# Patient Record
Sex: Female | Born: 1945
Health system: Southern US, Community
[De-identification: ages and names within clinical notes are randomized; demographics above are authoritative.]

## PROBLEM LIST (undated history)

## (undated) DIAGNOSIS — I10 Essential (primary) hypertension: Secondary | ICD-10-CM

## (undated) DIAGNOSIS — G589 Mononeuropathy, unspecified: Secondary | ICD-10-CM

## (undated) DIAGNOSIS — S149XXA Injury of unspecified nerves of neck, initial encounter: Secondary | ICD-10-CM

## (undated) DIAGNOSIS — E7800 Pure hypercholesterolemia, unspecified: Secondary | ICD-10-CM

## (undated) DIAGNOSIS — C4492 Squamous cell carcinoma of skin, unspecified: Secondary | ICD-10-CM

## (undated) HISTORY — PX: OTHER SURGICAL HISTORY: SHX169

## (undated) HISTORY — PX: NECK SURGERY: SHX720

---
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
## 1997-08-17 ENCOUNTER — Other Ambulatory Visit: Admission: RE | Admit: 1997-08-17 | Discharge: 1997-08-17 | Payer: Self-pay | Admitting: Gynecology

## 1997-08-25 ENCOUNTER — Other Ambulatory Visit: Admission: RE | Admit: 1997-08-25 | Discharge: 1997-08-25 | Payer: Self-pay | Admitting: Gynecology

## 1998-08-22 ENCOUNTER — Other Ambulatory Visit: Admission: RE | Admit: 1998-08-22 | Discharge: 1998-08-22 | Payer: Self-pay | Admitting: Gynecology

## 1999-08-20 ENCOUNTER — Encounter: Admission: RE | Admit: 1999-08-20 | Discharge: 1999-08-20 | Payer: Self-pay | Admitting: Gynecology

## 1999-08-20 ENCOUNTER — Encounter: Payer: Self-pay | Admitting: Gynecology

## 1999-08-28 ENCOUNTER — Other Ambulatory Visit: Admission: RE | Admit: 1999-08-28 | Discharge: 1999-08-28 | Payer: Self-pay | Admitting: Gynecology

## 2000-08-20 ENCOUNTER — Encounter: Payer: Self-pay | Admitting: Gynecology

## 2000-08-20 ENCOUNTER — Encounter: Admission: RE | Admit: 2000-08-20 | Discharge: 2000-08-20 | Payer: Self-pay | Admitting: Gynecology

## 2000-08-28 ENCOUNTER — Other Ambulatory Visit: Admission: RE | Admit: 2000-08-28 | Discharge: 2000-08-28 | Payer: Self-pay | Admitting: Gynecology

## 2001-08-21 ENCOUNTER — Encounter: Admission: RE | Admit: 2001-08-21 | Discharge: 2001-08-21 | Payer: Self-pay | Admitting: Gynecology

## 2001-08-21 ENCOUNTER — Encounter: Payer: Self-pay | Admitting: Gynecology

## 2001-09-01 ENCOUNTER — Other Ambulatory Visit: Admission: RE | Admit: 2001-09-01 | Discharge: 2001-09-01 | Payer: Self-pay | Admitting: Gynecology

## 2002-09-03 ENCOUNTER — Other Ambulatory Visit: Admission: RE | Admit: 2002-09-03 | Discharge: 2002-09-03 | Payer: Self-pay | Admitting: Obstetrics and Gynecology

## 2003-09-09 ENCOUNTER — Other Ambulatory Visit: Admission: RE | Admit: 2003-09-09 | Discharge: 2003-09-09 | Payer: Self-pay | Admitting: Obstetrics and Gynecology

## 2003-09-28 ENCOUNTER — Encounter: Admission: RE | Admit: 2003-09-28 | Discharge: 2003-09-28 | Payer: Self-pay | Admitting: *Deleted

## 2003-10-26 ENCOUNTER — Ambulatory Visit (HOSPITAL_COMMUNITY): Admission: RE | Admit: 2003-10-26 | Discharge: 2003-10-27 | Payer: Self-pay | Admitting: Orthopaedic Surgery

## 2004-09-11 ENCOUNTER — Other Ambulatory Visit: Admission: RE | Admit: 2004-09-11 | Discharge: 2004-09-11 | Payer: Self-pay | Admitting: Obstetrics and Gynecology

## 2005-09-24 ENCOUNTER — Ambulatory Visit (HOSPITAL_COMMUNITY): Admission: RE | Admit: 2005-09-24 | Discharge: 2005-09-24 | Payer: Self-pay | Admitting: Obstetrics and Gynecology

## 2007-11-03 ENCOUNTER — Ambulatory Visit (HOSPITAL_COMMUNITY): Admission: RE | Admit: 2007-11-03 | Discharge: 2007-11-03 | Payer: Self-pay | Admitting: Obstetrics and Gynecology

## 2010-08-24 NOTE — Op Note (Signed)
NAME:  Kayla Garner, Kayla Garner                          ACCOUNT NO.:  [id number]   MEDICAL RECORD NO.:  [id number]                   PATIENT TYPE:  OIB   LOCATION:  NA                                   FACILITY:  [facility]   PHYSICIAN:  Sharolyn Douglas, M.D.                     DATE OF BIRTH:  Jan 20, 1946   DATE OF PROCEDURE:  10/26/2003  DATE OF DISCHARGE:                                 OPERATIVE REPORT   PREOPERATIVE DIAGNOSIS:  Cervical spondylotic radiculopathy, C5-6, C6-7.   POSTOPERATIVE DIAGNOSIS:  Cervical spondylotic radiculopathy, C5-6, C6-7.   OPERATION PERFORMED:  1. Anterior cervical diskectomy, C5-6 and C6-7 with decompression of the     spinal cord and nerve roots bilaterally.  2. Anterior cervical arthrodesis, C5-6 and C6-7, placement of two allograft     prosthesis spacers packed with local autogenous bone graft.  3. Anterior cervical plating C5-6 and C6-7 using the spinal concepts system.   SURGEON:  Sharolyn Douglas, M.D.   ASSISTANT:  Verlin Fester, P.A.   ANESTHESIA:  General endotracheal.   COMPLICATIONS:  None.   INDICATIONS FOR PROCEDURE:  The patient is a very pleasant 65 year old  female with neck and right upper extremity pain secondary to cervical  spondylotic radiculopathy, C5-6 and C6-7.  She has failed conservative  treatment options.  She has elected to undergo ACDF at C5-6 and C6-7 in  hopes of improving the symptoms.  Risks and benefits were discussed.   DESCRIPTION OF PROCEDURE:  The patient was properly identified in the  holding area and taken to the operating room.  She underwent general  endotracheal anesthesia without difficulty.  She was given prophylactic  intravenous antibiotics.  Carefully positioned on the operating table with  the neck in slight extension using the Mayfield head rest.  Five pounds of  halter traction was applied.  The neck was prepped and draped in the usual  sterile fashion.  A 4 cm incision was made at the level of the thyroid  cartilage along the left side of the neck in transverse fashion.  Dissection  was carried sharply through the platysma.  The interval between  sternocleidomastoid and strap muscles medially was developed down to the  prevertebral space.  The esophagus trachea and carotid sheath were  identified and protected at all times.  The C5-6 level was easily  identifiable by the anterior osteophyte.  A spinal needle was placed and x-  ray taken to confirm this location.  The longus colli muscle was elevated  out over the C5-6 and C6-7 disk spaces bilaterally.  Deep Shadowline  retractor placed.  Anterior osteophyte removed with Leksell rongeur.  Caspar  distraction pins were placed in C5, C6 and C7 vertebral bodies.  Gentle  distraction was applied.  Diskectomy carried back to the posterior  longitudinal ligament.  The disk material was degenerated.  There were large  uncovertebral spurs.  The microscope was draped and brought into the field.  High speed bur used to remove the cartilaginous end plates, to take down the  uncovertebral spurs and posterior vertebral margins.  A 2 mm Kerrison  rongeur used to complete  wide foraminotomies bilaterally.  At C5-6, the  posterior longitudinal ligament was taken down.  The foramina were  decompressed.  At C6-7 again the posterior longitudinal ligament was taken  down and the foramina were decompressed.  There was a chronic appearing disk  osteophyte complex on the right side which was impressing upon the thecal  sac.  This was completely decompressed.  Bleeding was controlled with  bipolar electrocautery and Gelfoam.  7 mm allograft prosthesis spacers were  packed with local autogenous bone graft collected from the bur shavings.  The prosthesis spacers were then packed into the interspace at C5-6 and C6-7  and carefully countersunk 1 mm.  We had a good interference bed.  The weight  was removed from the head.  Caspar distraction was removed.  We then placed   a 40 mm Spinal Concepts anterior cervical plate with six 12 mm locking  screws.  We had good screw purchase.  We ensured the locking mechanism  engaged.  The wound was irrigated.  Esophagus, trachea, carotid sheath  examined.  There were no apparent injuries.  Deep Penrose drain was placed.  Platysma was closed with interrupted 2-0 Vicryl sutures.  Subcutaneous layer  closed with interrupted 3-0 Vicryl followed by a running 4-0 subcuticular  Vicryl suture on the skin.  Benzoin and Steri-Strips were placed.  Sterile  dressing applied.  Soft collar placed.  The patient was extubated without  difficulty and transferred to the recovery room in stable condition able to  move her upper and lower extremities.                                               Sharolyn Douglas, M.D.    MC/MEDQ  D:  10/26/2003  T:  10/27/2003  Job:  [id number]

## 2011-04-23 DIAGNOSIS — C4492 Squamous cell carcinoma of skin, unspecified: Secondary | ICD-10-CM

## 2011-04-23 HISTORY — DX: Squamous cell carcinoma of skin, unspecified: C44.92

## 2011-11-26 ENCOUNTER — Other Ambulatory Visit: Payer: Self-pay | Admitting: Obstetrics and Gynecology

## 2011-11-26 DIAGNOSIS — Z1231 Encounter for screening mammogram for malignant neoplasm of breast: Secondary | ICD-10-CM

## 2011-11-28 ENCOUNTER — Ambulatory Visit
Admission: RE | Admit: 2011-11-28 | Discharge: 2011-11-28 | Disposition: A | Discharge status: Home / Self Care | Payer: Medicare PPO | Source: Ambulatory Visit | Attending: Obstetrics and Gynecology | Admitting: Obstetrics and Gynecology

## 2011-11-28 DIAGNOSIS — Z1231 Encounter for screening mammogram for malignant neoplasm of breast: Secondary | ICD-10-CM

## 2012-05-25 ENCOUNTER — Other Ambulatory Visit (HOSPITAL_COMMUNITY)
Admission: RE | Admit: 2012-05-25 | Discharge: 2012-05-25 | Disposition: A | Discharge status: Home / Self Care | Payer: Medicare PPO | Source: Ambulatory Visit | Attending: Obstetrics and Gynecology | Admitting: Obstetrics and Gynecology

## 2012-05-25 ENCOUNTER — Other Ambulatory Visit: Payer: Self-pay | Admitting: Obstetrics and Gynecology

## 2012-05-25 DIAGNOSIS — Z124 Encounter for screening for malignant neoplasm of cervix: Secondary | ICD-10-CM | POA: Insufficient documentation

## 2012-05-25 DIAGNOSIS — Z1151 Encounter for screening for human papillomavirus (HPV): Secondary | ICD-10-CM | POA: Insufficient documentation

## 2013-03-14 ENCOUNTER — Emergency Department (HOSPITAL_BASED_OUTPATIENT_CLINIC_OR_DEPARTMENT_OTHER): Payer: Medicare PPO

## 2013-03-14 ENCOUNTER — Encounter (HOSPITAL_BASED_OUTPATIENT_CLINIC_OR_DEPARTMENT_OTHER): Payer: Self-pay | Admitting: Emergency Medicine

## 2013-03-14 ENCOUNTER — Emergency Department (HOSPITAL_BASED_OUTPATIENT_CLINIC_OR_DEPARTMENT_OTHER)
Admission: EM | Admit: 2013-03-14 | Discharge: 2013-03-14 | Disposition: A | Discharge status: Home / Self Care | Payer: Medicare PPO | Attending: Emergency Medicine | Admitting: Emergency Medicine

## 2013-03-14 DIAGNOSIS — E7800 Pure hypercholesterolemia, unspecified: Secondary | ICD-10-CM | POA: Insufficient documentation

## 2013-03-14 DIAGNOSIS — Z9889 Other specified postprocedural states: Secondary | ICD-10-CM | POA: Insufficient documentation

## 2013-03-14 DIAGNOSIS — M542 Cervicalgia: Secondary | ICD-10-CM

## 2013-03-14 DIAGNOSIS — Z8669 Personal history of other diseases of the nervous system and sense organs: Secondary | ICD-10-CM | POA: Insufficient documentation

## 2013-03-14 DIAGNOSIS — I10 Essential (primary) hypertension: Secondary | ICD-10-CM | POA: Insufficient documentation

## 2013-03-14 DIAGNOSIS — Z7982 Long term (current) use of aspirin: Secondary | ICD-10-CM | POA: Insufficient documentation

## 2013-03-14 DIAGNOSIS — Z79899 Other long term (current) drug therapy: Secondary | ICD-10-CM | POA: Insufficient documentation

## 2013-03-14 HISTORY — DX: Mononeuropathy, unspecified: G58.9

## 2013-03-14 HISTORY — DX: Pure hypercholesterolemia, unspecified: E78.00

## 2013-03-14 HISTORY — DX: Injury of unspecified nerves of neck, initial encounter: S14.9XXA

## 2013-03-14 HISTORY — DX: Essential (primary) hypertension: I10

## 2013-03-14 MED ORDER — CYCLOBENZAPRINE HCL 10 MG PO TABS: 10.0000 mg | ORAL_TABLET | Freq: Three times a day (TID) | ORAL | Status: AC | PRN

## 2013-03-14 MED ORDER — KETOROLAC TROMETHAMINE 60 MG/2ML IM SOLN
60.0000 mg | Freq: Once | INTRAMUSCULAR | Status: AC
  Administered 2013-03-14: 60 mg via INTRAMUSCULAR
  Filled 2013-03-14: qty 2

## 2013-03-14 MED ORDER — CYCLOBENZAPRINE HCL 10 MG PO TABS
10.0000 mg | ORAL_TABLET | Freq: Once | ORAL | Status: AC
  Administered 2013-03-14: 10 mg via ORAL
  Filled 2013-03-14: qty 1

## 2013-03-14 NOTE — ED Notes (Signed)
Left sided neck pain x 2 weeks.  Sneezed and made it worse.

## 2013-03-14 NOTE — ED Provider Notes (Signed)
CSN: [id number]     Arrival date & time 03/14/13  0831 History   First MD Initiated Contact with Patient 03/14/13 [phone]     Chief Complaint  Patient presents with  . Neck Pain   (Consider location/radiation/quality/duration/timing/severity/associated sxs/prior Treatment) HPI Comments: Patient is a 67 year old female with history of cervical fusion performed 15 years ago. She presents today with a two-week history of pain in the left side of her neck. She denies any injury or trauma. She denies any radiation down her arm and denies any weakness. She has no bowel or bladder complaints. She states that she sneezed yesterday and this seemed to make her discomforts substantially worse.  Patient is a 67 y.o. female presenting with neck pain. The history is provided by the patient.  Neck Pain Pain location:  L side Quality:  Cramping Pain radiates to:  Does not radiate Pain severity:  Moderate Pain is:  Same all the time Onset quality:  Gradual Duration:  2 weeks Timing:  Constant Progression:  Worsening Chronicity:  New Context: not fall   Relieved by:  Nothing Worsened by:  Sneezing Ineffective treatments:  Heat   Past Medical History  Diagnosis Date  . Pinched nerve in neck   . Hypertension   . Hypercholesteremia    Past Surgical History  Procedure Laterality Date  . Neck surgery    . Titanium plate and screws in neck     No family history on file. History  Substance Use Topics  . Smoking status: Never Smoker   . Smokeless tobacco: Not on file  . Alcohol Use: Not on file   OB History   Grav Para Term Preterm Abortions TAB SAB Ect Mult Living                 Review of Systems  Musculoskeletal: Positive for neck pain.  All other systems reviewed and are negative.    Allergies  Review of patient's allergies indicates no known allergies.  Home Medications   Current Outpatient Rx  Name  Route  Sig  Dispense  Refill  . amLODipine (NORVASC) 5 MG tablet   Oral  Take 5 mg by mouth daily.         . Ascorbic Acid (VITAMIN C) 1000 MG tablet   Oral   Take 1,000 mg by mouth daily.         Marland Kitchen aspirin 81 MG tablet   Oral   Take 81 mg by mouth daily.         . cetirizine (ZYRTEC) 10 MG tablet   Oral   Take 10 mg by mouth daily.         . Cholecalciferol (VITAMIN D3) 2000 UNITS TABS   Oral   Take 1 tablet by mouth daily.         . Coenzyme Q10 (CO Q-10) 200 MG CAPS   Oral   Take 1 capsule by mouth daily.         Marland Kitchen DHEA 25 MG CAPS   Oral   Take 1 capsule by mouth.         . diphenhydrAMINE (BENADRYL) 25 mg capsule   Oral   Take 25 mg by mouth every 6 (six) hours as needed.         Boris Lown Oil (MAXIMUM RED KRILL PO)   Oral   Take 500 mg by mouth daily.         Marland Kitchen loratadine (CLARITIN) 10 MG tablet   Oral  Take 10 mg by mouth daily.         Marland Kitchen lovastatin (MEVACOR) 20 MG tablet   Oral   Take 20 mg by mouth at bedtime.         . metoprolol tartrate (LOPRESSOR) 25 MG tablet   Oral   Take 25 mg by mouth 2 (two) times daily.         . Multiple Vitamins-Minerals (CENTRUM SILVER ULTRA WOMENS PO)   Oral   Take 1 tablet by mouth daily.         . simethicone (MYLICON) 125 MG chewable tablet   Oral   Chew 125 mg by mouth every 6 (six) hours as needed for flatulence.          BP 135/93  Pulse 94  Temp(Src) 98.1 F (36.7 C) (Oral)  Resp 18  Ht 5\' 3"  (1.6 m)  Wt 117 lb (53.071 kg)  BMI 20.73 kg/m2  SpO2 100% Physical Exam  Nursing note reviewed. Constitutional: She is oriented to person, place, and time. She appears well-developed and well-nourished. No distress.  HENT:  Head: Normocephalic and atraumatic.  Neck: Normal range of motion. Neck supple.  There is tenderness to palpation on the lateral aspect of the left neck. There is no palpable abnormality. There is no nuchal rigidity. She does have increasing pain with turning her head to the left.  Musculoskeletal: Normal range of motion. She exhibits no  edema.  Lymphadenopathy:    She has no cervical adenopathy.  Neurological: She is alert and oriented to person, place, and time.  Skin: Skin is warm and dry. She is not diaphoretic.    ED Course  Procedures (including critical care time) Labs Review Labs Reviewed - No data to display Imaging Review No results found.    MDM  No diagnosis found. Patient with history of cervical fusion presents with neck pain. Her neurologic exam is unremarkable and x-rays show the hardware to be in appropriate positioning. She will be given Toradol and Flexeril and discharged with NSAIDs and Flexeril. She is to return if her symptoms worsen and if she does not improve she is to follow up with her spine    Geoffery Lyons, MD 03/14/13 [phone]

## 2013-05-31 ENCOUNTER — Other Ambulatory Visit: Payer: Self-pay

## 2013-05-31 DIAGNOSIS — Z1231 Encounter for screening mammogram for malignant neoplasm of breast: Secondary | ICD-10-CM

## 2013-06-18 ENCOUNTER — Ambulatory Visit
Admission: RE | Admit: 2013-06-18 | Discharge: 2013-06-18 | Disposition: A | Discharge status: Home / Self Care | Payer: Medicare PPO | Source: Ambulatory Visit

## 2013-06-18 DIAGNOSIS — Z1231 Encounter for screening mammogram for malignant neoplasm of breast: Secondary | ICD-10-CM

## 2014-04-15 DIAGNOSIS — N12 Tubulo-interstitial nephritis, not specified as acute or chronic: Secondary | ICD-10-CM | POA: Diagnosis not present

## 2014-04-15 DIAGNOSIS — R6883 Chills (without fever): Secondary | ICD-10-CM | POA: Diagnosis not present

## 2014-04-15 DIAGNOSIS — R350 Frequency of micturition: Secondary | ICD-10-CM | POA: Diagnosis not present

## 2014-05-06 DIAGNOSIS — R319 Hematuria, unspecified: Secondary | ICD-10-CM | POA: Diagnosis not present

## 2014-05-31 DIAGNOSIS — M859 Disorder of bone density and structure, unspecified: Secondary | ICD-10-CM | POA: Diagnosis not present

## 2014-05-31 DIAGNOSIS — M8580 Other specified disorders of bone density and structure, unspecified site: Secondary | ICD-10-CM | POA: Diagnosis not present

## 2014-07-25 ENCOUNTER — Other Ambulatory Visit (HOSPITAL_COMMUNITY): Payer: Self-pay | Admitting: Obstetrics and Gynecology

## 2014-07-25 DIAGNOSIS — J301 Allergic rhinitis due to pollen: Secondary | ICD-10-CM | POA: Diagnosis not present

## 2014-07-25 DIAGNOSIS — I10 Essential (primary) hypertension: Secondary | ICD-10-CM | POA: Diagnosis not present

## 2014-07-25 DIAGNOSIS — E780 Pure hypercholesterolemia: Secondary | ICD-10-CM | POA: Diagnosis not present

## 2014-07-25 DIAGNOSIS — Z0000 Encounter for general adult medical examination without abnormal findings: Secondary | ICD-10-CM | POA: Diagnosis not present

## 2014-07-25 DIAGNOSIS — Z1231 Encounter for screening mammogram for malignant neoplasm of breast: Secondary | ICD-10-CM

## 2014-07-25 DIAGNOSIS — G3184 Mild cognitive impairment, so stated: Secondary | ICD-10-CM | POA: Diagnosis not present

## 2014-07-25 DIAGNOSIS — E559 Vitamin D deficiency, unspecified: Secondary | ICD-10-CM | POA: Diagnosis not present

## 2014-08-02 ENCOUNTER — Ambulatory Visit (HOSPITAL_COMMUNITY)
Admission: RE | Admit: 2014-08-02 | Discharge: 2014-08-02 | Disposition: A | Discharge status: Home / Self Care | Payer: Commercial Managed Care - HMO | Source: Ambulatory Visit | Attending: Obstetrics and Gynecology | Admitting: Obstetrics and Gynecology

## 2014-08-02 DIAGNOSIS — Z1231 Encounter for screening mammogram for malignant neoplasm of breast: Secondary | ICD-10-CM | POA: Diagnosis not present

## 2015-02-14 DIAGNOSIS — G3184 Mild cognitive impairment, so stated: Secondary | ICD-10-CM | POA: Diagnosis not present

## 2015-06-08 ENCOUNTER — Other Ambulatory Visit: Payer: Self-pay | Admitting: Gastroenterology

## 2015-06-08 DIAGNOSIS — D120 Benign neoplasm of cecum: Secondary | ICD-10-CM | POA: Diagnosis not present

## 2015-06-08 DIAGNOSIS — D126 Benign neoplasm of colon, unspecified: Secondary | ICD-10-CM | POA: Diagnosis not present

## 2015-06-08 DIAGNOSIS — Z86010 Personal history of colonic polyps: Secondary | ICD-10-CM | POA: Diagnosis not present

## 2015-06-08 DIAGNOSIS — K5730 Diverticulosis of large intestine without perforation or abscess without bleeding: Secondary | ICD-10-CM | POA: Diagnosis not present

## 2015-07-04 ENCOUNTER — Other Ambulatory Visit: Payer: Self-pay

## 2015-07-04 DIAGNOSIS — Z1231 Encounter for screening mammogram for malignant neoplasm of breast: Secondary | ICD-10-CM

## 2015-07-27 DIAGNOSIS — J301 Allergic rhinitis due to pollen: Secondary | ICD-10-CM | POA: Diagnosis not present

## 2015-07-27 DIAGNOSIS — G3184 Mild cognitive impairment, so stated: Secondary | ICD-10-CM | POA: Diagnosis not present

## 2015-07-27 DIAGNOSIS — E559 Vitamin D deficiency, unspecified: Secondary | ICD-10-CM | POA: Diagnosis not present

## 2015-07-27 DIAGNOSIS — Z0000 Encounter for general adult medical examination without abnormal findings: Secondary | ICD-10-CM | POA: Diagnosis not present

## 2015-07-27 DIAGNOSIS — I10 Essential (primary) hypertension: Secondary | ICD-10-CM | POA: Diagnosis not present

## 2015-07-27 DIAGNOSIS — E7800 Pure hypercholesterolemia, unspecified: Secondary | ICD-10-CM | POA: Diagnosis not present

## 2015-08-04 ENCOUNTER — Ambulatory Visit
Admission: RE | Admit: 2015-08-04 | Discharge: 2015-08-04 | Disposition: A | Discharge status: Home / Self Care | Payer: Commercial Managed Care - HMO | Source: Ambulatory Visit

## 2015-08-04 DIAGNOSIS — Z1231 Encounter for screening mammogram for malignant neoplasm of breast: Secondary | ICD-10-CM

## 2015-08-08 ENCOUNTER — Other Ambulatory Visit: Payer: Self-pay | Admitting: Obstetrics and Gynecology

## 2015-08-08 DIAGNOSIS — R928 Other abnormal and inconclusive findings on diagnostic imaging of breast: Secondary | ICD-10-CM

## 2015-08-29 ENCOUNTER — Ambulatory Visit
Admission: RE | Admit: 2015-08-29 | Discharge: 2015-08-29 | Disposition: A | Discharge status: Home / Self Care | Payer: Commercial Managed Care - HMO | Source: Ambulatory Visit | Attending: Obstetrics and Gynecology | Admitting: Obstetrics and Gynecology

## 2015-08-29 DIAGNOSIS — R928 Other abnormal and inconclusive findings on diagnostic imaging of breast: Secondary | ICD-10-CM

## 2015-08-29 DIAGNOSIS — N63 Unspecified lump in breast: Secondary | ICD-10-CM | POA: Diagnosis not present

## 2016-01-09 DIAGNOSIS — G3184 Mild cognitive impairment, so stated: Secondary | ICD-10-CM | POA: Diagnosis not present

## 2016-03-19 ENCOUNTER — Other Ambulatory Visit: Payer: Self-pay | Admitting: Obstetrics and Gynecology

## 2016-03-19 DIAGNOSIS — N6310 Unspecified lump in the right breast, unspecified quadrant: Secondary | ICD-10-CM

## 2016-04-09 ENCOUNTER — Ambulatory Visit
Admission: RE | Admit: 2016-04-09 | Discharge: 2016-04-09 | Disposition: A | Discharge status: Home / Self Care | Payer: Commercial Managed Care - HMO | Source: Ambulatory Visit | Attending: Obstetrics and Gynecology | Admitting: Obstetrics and Gynecology

## 2016-04-09 ENCOUNTER — Other Ambulatory Visit: Payer: Self-pay | Admitting: Obstetrics and Gynecology

## 2016-04-09 DIAGNOSIS — N6001 Solitary cyst of right breast: Secondary | ICD-10-CM | POA: Diagnosis not present

## 2016-04-09 DIAGNOSIS — R922 Inconclusive mammogram: Secondary | ICD-10-CM | POA: Diagnosis not present

## 2016-04-09 DIAGNOSIS — N6310 Unspecified lump in the right breast, unspecified quadrant: Secondary | ICD-10-CM

## 2016-04-30 DIAGNOSIS — F0391 Unspecified dementia with behavioral disturbance: Secondary | ICD-10-CM | POA: Diagnosis not present

## 2016-06-27 DIAGNOSIS — G301 Alzheimer's disease with late onset: Secondary | ICD-10-CM | POA: Diagnosis not present

## 2016-06-27 DIAGNOSIS — R634 Abnormal weight loss: Secondary | ICD-10-CM | POA: Diagnosis not present

## 2016-06-27 DIAGNOSIS — F0281 Dementia in other diseases classified elsewhere with behavioral disturbance: Secondary | ICD-10-CM | POA: Diagnosis not present

## 2016-07-31 DIAGNOSIS — E559 Vitamin D deficiency, unspecified: Secondary | ICD-10-CM | POA: Diagnosis not present

## 2016-07-31 DIAGNOSIS — Z0000 Encounter for general adult medical examination without abnormal findings: Secondary | ICD-10-CM | POA: Diagnosis not present

## 2016-07-31 DIAGNOSIS — R350 Frequency of micturition: Secondary | ICD-10-CM | POA: Diagnosis not present

## 2016-07-31 DIAGNOSIS — E7800 Pure hypercholesterolemia, unspecified: Secondary | ICD-10-CM | POA: Diagnosis not present

## 2016-07-31 DIAGNOSIS — I10 Essential (primary) hypertension: Secondary | ICD-10-CM | POA: Diagnosis not present

## 2016-07-31 DIAGNOSIS — R634 Abnormal weight loss: Secondary | ICD-10-CM | POA: Diagnosis not present

## 2016-07-31 DIAGNOSIS — F0390 Unspecified dementia without behavioral disturbance: Secondary | ICD-10-CM | POA: Diagnosis not present

## 2016-08-12 DIAGNOSIS — J209 Acute bronchitis, unspecified: Secondary | ICD-10-CM | POA: Diagnosis not present

## 2016-08-26 DIAGNOSIS — Z87440 Personal history of urinary (tract) infections: Secondary | ICD-10-CM | POA: Diagnosis not present

## 2016-08-26 DIAGNOSIS — R350 Frequency of micturition: Secondary | ICD-10-CM | POA: Diagnosis not present

## 2016-10-02 DIAGNOSIS — R404 Transient alteration of awareness: Secondary | ICD-10-CM | POA: Diagnosis not present

## 2016-10-02 DIAGNOSIS — R55 Syncope and collapse: Secondary | ICD-10-CM | POA: Diagnosis not present

## 2016-10-03 DIAGNOSIS — R55 Syncope and collapse: Secondary | ICD-10-CM | POA: Diagnosis not present

## 2016-10-07 ENCOUNTER — Telehealth: Payer: Self-pay

## 2016-10-07 NOTE — Telephone Encounter (Signed)
SENT NOTE SCHEDULING

## 2016-10-10 ENCOUNTER — Other Ambulatory Visit: Payer: Self-pay | Admitting: Obstetrics and Gynecology

## 2016-10-10 DIAGNOSIS — N6001 Solitary cyst of right breast: Secondary | ICD-10-CM

## 2016-10-11 DIAGNOSIS — G309 Alzheimer's disease, unspecified: Secondary | ICD-10-CM | POA: Diagnosis not present

## 2016-10-11 DIAGNOSIS — F0281 Dementia in other diseases classified elsewhere with behavioral disturbance: Secondary | ICD-10-CM | POA: Diagnosis not present

## 2016-10-22 ENCOUNTER — Ambulatory Visit
Admission: RE | Admit: 2016-10-22 | Discharge: 2016-10-22 | Disposition: A | Discharge status: Home / Self Care | Payer: Commercial Managed Care - HMO | Source: Ambulatory Visit | Attending: Obstetrics and Gynecology | Admitting: Obstetrics and Gynecology

## 2016-10-22 DIAGNOSIS — N6001 Solitary cyst of right breast: Secondary | ICD-10-CM

## 2016-10-22 DIAGNOSIS — R922 Inconclusive mammogram: Secondary | ICD-10-CM | POA: Diagnosis not present

## 2016-10-22 DIAGNOSIS — N6311 Unspecified lump in the right breast, upper outer quadrant: Secondary | ICD-10-CM | POA: Diagnosis not present

## 2016-11-04 DIAGNOSIS — R7301 Impaired fasting glucose: Secondary | ICD-10-CM | POA: Diagnosis not present

## 2016-11-24 DIAGNOSIS — R55 Syncope and collapse: Secondary | ICD-10-CM | POA: Insufficient documentation

## 2016-11-24 NOTE — Progress Notes (Deleted)
Cardiology Office Note    Date:  11/24/2016   ID:  DERISHA Garner, DOB 1946-01-26, MRN [id number]  PCP:  Darcus Austin, MD  Cardiologist: Sinclair Grooms, MD   No chief complaint on file.   History of Present Illness:  Kayla Garner is a 71 y.o. female referred for evaluation of syncope.    Past Medical History:  Diagnosis Date  . Hypercholesteremia   . Hypertension   . Pinched nerve in neck     Past Surgical History:  Procedure Laterality Date  . NECK SURGERY    . titanium plate and screws in neck      Current Medications: Outpatient Medications Prior to Visit  Medication Sig Dispense Refill  . amLODipine (NORVASC) 5 MG tablet Take 5 mg by mouth daily.    . Ascorbic Acid (VITAMIN C) 1000 MG tablet Take 1,000 mg by mouth daily.    Marland Kitchen aspirin 81 MG tablet Take 81 mg by mouth daily.    . cetirizine (ZYRTEC) 10 MG tablet Take 10 mg by mouth daily.    . Cholecalciferol (VITAMIN D3) 2000 UNITS TABS Take 1 tablet by mouth daily.    . Coenzyme Q10 (CO Q-10) 200 MG CAPS Take 1 capsule by mouth daily.    . cyclobenzaprine (FLEXERIL) 10 MG tablet Take 1 tablet (10 mg total) by mouth 3 (three) times daily as needed for muscle spasms. 20 tablet 0  . DHEA 25 MG CAPS Take 1 capsule by mouth.    . diphenhydrAMINE (BENADRYL) 25 mg capsule Take 25 mg by mouth every 6 (six) hours as needed.    Javier Docker Oil (MAXIMUM RED KRILL PO) Take 500 mg by mouth daily.    Marland Kitchen loratadine (CLARITIN) 10 MG tablet Take 10 mg by mouth daily.    Marland Kitchen lovastatin (MEVACOR) 20 MG tablet Take 20 mg by mouth at bedtime.    . metoprolol tartrate (LOPRESSOR) 25 MG tablet Take 25 mg by mouth 2 (two) times daily.    . Multiple Vitamins-Minerals (CENTRUM SILVER ULTRA WOMENS PO) Take 1 tablet by mouth daily.    . simethicone (MYLICON) 270 MG chewable tablet Chew 125 mg by mouth every 6 (six) hours as needed for flatulence.     No facility-administered medications prior to visit.      Allergies:   Patient has no  known allergies.   Social History   Social History  . Marital status: Married    Spouse name: N/A  . Number of children: N/A  . Years of education: N/A   Social History Main Topics  . Smoking status: Never Smoker  . Smokeless tobacco: Not on file  . Alcohol use Not on file  . Drug use: Unknown  . Sexual activity: Not on file   Other Topics Concern  . Not on file   Social History Narrative  . No narrative on file     Family History:  The patient's ***family history is not on file.   ROS:   Please see the history of present illness.    ***  All other systems reviewed and are negative.   PHYSICAL EXAM:   VS:  There were no vitals taken for this visit.   GEN: Well nourished, well developed, in no acute distress  HEENT: normal  Neck: no JVD, carotid bruits, or masses Cardiac: ***RRR; no murmurs, rubs, or gallops,no edema  Respiratory:  clear to auscultation bilaterally, normal work of breathing GI: soft, nontender, nondistended, + BS  MS: no deformity or atrophy  Skin: warm and dry, no rash Neuro:  Alert and Oriented x 3, Strength and sensation are intact Psych: euthymic mood, full affect  Wt Readings from Last 3 Encounters:  03/14/13 117 lb (53.1 kg)      Studies/Labs Reviewed:   EKG:  EKG  ***  Recent Labs: No results found for requested labs within last 8760 hours.   Lipid Panel No results found for: CHOL, TRIG, HDL, CHOLHDL, VLDL, LDLCALC, LDLDIRECT  Additional studies/ records that were reviewed today include:  ***    ASSESSMENT:    1. Syncope, unspecified syncope type      PLAN:  In order of problems listed above:  1. ***    Medication Adjustments/Labs and Tests Ordered: Current medicines are reviewed at length with the patient today.  Concerns regarding medicines are outlined above.  Medication changes, Labs and Tests ordered today are listed in the Patient Instructions below. There are no Patient Instructions on file for this visit.    Signed, Sinclair Grooms, MD  11/24/2016 2:57 PM    [facility] [address], [place], [place]  [address] Phone: [phone]; Fax: [phone]

## 2016-11-25 ENCOUNTER — Ambulatory Visit: Payer: Commercial Managed Care - HMO | Admitting: Interventional Cardiology

## 2016-12-26 DIAGNOSIS — N390 Urinary tract infection, site not specified: Secondary | ICD-10-CM | POA: Diagnosis not present

## 2017-02-17 DIAGNOSIS — D485 Neoplasm of uncertain behavior of skin: Secondary | ICD-10-CM | POA: Diagnosis not present

## 2017-08-13 DIAGNOSIS — I10 Essential (primary) hypertension: Secondary | ICD-10-CM | POA: Diagnosis not present

## 2017-08-13 DIAGNOSIS — Z1159 Encounter for screening for other viral diseases: Secondary | ICD-10-CM | POA: Diagnosis not present

## 2017-08-13 DIAGNOSIS — Z23 Encounter for immunization: Secondary | ICD-10-CM | POA: Diagnosis not present

## 2017-08-13 DIAGNOSIS — E559 Vitamin D deficiency, unspecified: Secondary | ICD-10-CM | POA: Diagnosis not present

## 2017-08-13 DIAGNOSIS — J301 Allergic rhinitis due to pollen: Secondary | ICD-10-CM | POA: Diagnosis not present

## 2017-08-13 DIAGNOSIS — E7800 Pure hypercholesterolemia, unspecified: Secondary | ICD-10-CM | POA: Diagnosis not present

## 2017-08-13 DIAGNOSIS — Z0000 Encounter for general adult medical examination without abnormal findings: Secondary | ICD-10-CM | POA: Diagnosis not present

## 2017-08-13 DIAGNOSIS — F0390 Unspecified dementia without behavioral disturbance: Secondary | ICD-10-CM | POA: Diagnosis not present

## 2017-09-17 ENCOUNTER — Other Ambulatory Visit: Payer: Self-pay | Admitting: Obstetrics and Gynecology

## 2017-09-17 DIAGNOSIS — N6009 Solitary cyst of unspecified breast: Secondary | ICD-10-CM

## 2017-10-23 ENCOUNTER — Ambulatory Visit
Admission: RE | Admit: 2017-10-23 | Discharge: 2017-10-23 | Disposition: A | Discharge status: Home / Self Care | Payer: Commercial Managed Care - HMO | Source: Ambulatory Visit | Attending: Obstetrics and Gynecology | Admitting: Obstetrics and Gynecology

## 2017-10-23 ENCOUNTER — Ambulatory Visit
Admission: RE | Admit: 2017-10-23 | Discharge: 2017-10-23 | Disposition: A | Discharge status: Home / Self Care | Payer: Medicare HMO | Source: Ambulatory Visit | Attending: Obstetrics and Gynecology | Admitting: Obstetrics and Gynecology

## 2017-10-23 DIAGNOSIS — N6311 Unspecified lump in the right breast, upper outer quadrant: Secondary | ICD-10-CM | POA: Diagnosis not present

## 2017-10-23 DIAGNOSIS — R922 Inconclusive mammogram: Secondary | ICD-10-CM | POA: Diagnosis not present

## 2017-10-23 DIAGNOSIS — N6009 Solitary cyst of unspecified breast: Secondary | ICD-10-CM

## 2017-10-31 DIAGNOSIS — G301 Alzheimer's disease with late onset: Secondary | ICD-10-CM | POA: Diagnosis not present

## 2017-10-31 DIAGNOSIS — F0281 Dementia in other diseases classified elsewhere with behavioral disturbance: Secondary | ICD-10-CM | POA: Diagnosis not present

## 2018-01-19 DIAGNOSIS — Z23 Encounter for immunization: Secondary | ICD-10-CM | POA: Diagnosis not present

## 2018-05-08 DIAGNOSIS — F0281 Dementia in other diseases classified elsewhere with behavioral disturbance: Secondary | ICD-10-CM | POA: Diagnosis not present

## 2018-05-08 DIAGNOSIS — G301 Alzheimer's disease with late onset: Secondary | ICD-10-CM | POA: Diagnosis not present

## 2018-08-21 DIAGNOSIS — E559 Vitamin D deficiency, unspecified: Secondary | ICD-10-CM | POA: Diagnosis not present

## 2018-08-21 DIAGNOSIS — E7800 Pure hypercholesterolemia, unspecified: Secondary | ICD-10-CM | POA: Diagnosis not present

## 2018-08-21 DIAGNOSIS — Z79899 Other long term (current) drug therapy: Secondary | ICD-10-CM | POA: Diagnosis not present

## 2018-08-25 DIAGNOSIS — Z0000 Encounter for general adult medical examination without abnormal findings: Secondary | ICD-10-CM | POA: Diagnosis not present

## 2018-08-25 DIAGNOSIS — E7800 Pure hypercholesterolemia, unspecified: Secondary | ICD-10-CM | POA: Diagnosis not present

## 2018-08-25 DIAGNOSIS — I10 Essential (primary) hypertension: Secondary | ICD-10-CM | POA: Diagnosis not present

## 2018-08-25 DIAGNOSIS — F322 Major depressive disorder, single episode, severe without psychotic features: Secondary | ICD-10-CM | POA: Diagnosis not present

## 2018-08-25 DIAGNOSIS — F0390 Unspecified dementia without behavioral disturbance: Secondary | ICD-10-CM | POA: Diagnosis not present

## 2018-08-25 DIAGNOSIS — E559 Vitamin D deficiency, unspecified: Secondary | ICD-10-CM | POA: Diagnosis not present

## 2018-09-15 ENCOUNTER — Other Ambulatory Visit: Payer: Self-pay | Admitting: Obstetrics and Gynecology

## 2018-09-15 DIAGNOSIS — Z1231 Encounter for screening mammogram for malignant neoplasm of breast: Secondary | ICD-10-CM

## 2018-09-15 DIAGNOSIS — Z20828 Contact with and (suspected) exposure to other viral communicable diseases: Secondary | ICD-10-CM | POA: Diagnosis not present

## 2018-10-26 DIAGNOSIS — N390 Urinary tract infection, site not specified: Secondary | ICD-10-CM | POA: Diagnosis not present

## 2018-10-26 DIAGNOSIS — R319 Hematuria, unspecified: Secondary | ICD-10-CM | POA: Diagnosis not present

## 2018-11-02 ENCOUNTER — Ambulatory Visit: Payer: Medicare HMO

## 2018-11-04 ENCOUNTER — Other Ambulatory Visit: Payer: Self-pay

## 2018-11-04 ENCOUNTER — Ambulatory Visit
Admission: RE | Admit: 2018-11-04 | Discharge: 2018-11-04 | Disposition: A | Discharge status: Home / Self Care | Payer: Medicare HMO | Source: Ambulatory Visit | Attending: Obstetrics and Gynecology | Admitting: Obstetrics and Gynecology

## 2018-11-04 DIAGNOSIS — Z1231 Encounter for screening mammogram for malignant neoplasm of breast: Secondary | ICD-10-CM | POA: Diagnosis not present

## 2018-11-06 DIAGNOSIS — I10 Essential (primary) hypertension: Secondary | ICD-10-CM | POA: Diagnosis not present

## 2018-11-06 DIAGNOSIS — F0281 Dementia in other diseases classified elsewhere with behavioral disturbance: Secondary | ICD-10-CM | POA: Diagnosis not present

## 2018-11-06 DIAGNOSIS — G301 Alzheimer's disease with late onset: Secondary | ICD-10-CM | POA: Diagnosis not present

## 2019-03-03 DIAGNOSIS — N3000 Acute cystitis without hematuria: Secondary | ICD-10-CM | POA: Diagnosis not present

## 2019-03-09 DIAGNOSIS — I10 Essential (primary) hypertension: Secondary | ICD-10-CM | POA: Diagnosis not present

## 2019-03-09 DIAGNOSIS — F0390 Unspecified dementia without behavioral disturbance: Secondary | ICD-10-CM | POA: Diagnosis not present

## 2019-03-09 DIAGNOSIS — F322 Major depressive disorder, single episode, severe without psychotic features: Secondary | ICD-10-CM | POA: Diagnosis not present

## 2019-03-09 DIAGNOSIS — J301 Allergic rhinitis due to pollen: Secondary | ICD-10-CM | POA: Diagnosis not present

## 2019-03-25 DIAGNOSIS — G301 Alzheimer's disease with late onset: Secondary | ICD-10-CM | POA: Diagnosis not present

## 2019-03-25 DIAGNOSIS — F0281 Dementia in other diseases classified elsewhere with behavioral disturbance: Secondary | ICD-10-CM | POA: Diagnosis not present

## 2019-04-13 DIAGNOSIS — R0602 Shortness of breath: Secondary | ICD-10-CM | POA: Diagnosis not present

## 2019-04-13 DIAGNOSIS — Z20828 Contact with and (suspected) exposure to other viral communicable diseases: Secondary | ICD-10-CM | POA: Diagnosis not present

## 2019-04-14 DIAGNOSIS — N390 Urinary tract infection, site not specified: Secondary | ICD-10-CM | POA: Diagnosis not present

## 2019-04-14 DIAGNOSIS — R300 Dysuria: Secondary | ICD-10-CM | POA: Diagnosis not present

## 2019-06-11 DIAGNOSIS — R350 Frequency of micturition: Secondary | ICD-10-CM | POA: Diagnosis not present

## 2019-07-13 DIAGNOSIS — F0390 Unspecified dementia without behavioral disturbance: Secondary | ICD-10-CM | POA: Diagnosis not present

## 2019-07-13 DIAGNOSIS — I10 Essential (primary) hypertension: Secondary | ICD-10-CM | POA: Diagnosis not present

## 2019-07-13 DIAGNOSIS — E7800 Pure hypercholesterolemia, unspecified: Secondary | ICD-10-CM | POA: Diagnosis not present

## 2019-07-13 DIAGNOSIS — F5101 Primary insomnia: Secondary | ICD-10-CM | POA: Diagnosis not present

## 2019-07-13 DIAGNOSIS — F324 Major depressive disorder, single episode, in partial remission: Secondary | ICD-10-CM | POA: Diagnosis not present

## 2019-07-13 DIAGNOSIS — N3281 Overactive bladder: Secondary | ICD-10-CM | POA: Diagnosis not present

## 2019-07-13 DIAGNOSIS — Z8616 Personal history of COVID-19: Secondary | ICD-10-CM | POA: Diagnosis not present

## 2019-10-19 DIAGNOSIS — F0281 Dementia in other diseases classified elsewhere with behavioral disturbance: Secondary | ICD-10-CM | POA: Diagnosis not present

## 2019-10-19 DIAGNOSIS — G301 Alzheimer's disease with late onset: Secondary | ICD-10-CM | POA: Diagnosis not present

## 2019-10-27 DIAGNOSIS — N390 Urinary tract infection, site not specified: Secondary | ICD-10-CM | POA: Diagnosis not present

## 2019-10-27 DIAGNOSIS — F0390 Unspecified dementia without behavioral disturbance: Secondary | ICD-10-CM | POA: Diagnosis not present

## 2019-11-23 ENCOUNTER — Telehealth: Payer: Self-pay | Admitting: Nurse Practitioner

## 2019-11-23 NOTE — Telephone Encounter (Signed)
Called patient's daughter, Threasa Beards, to schedule the Palliative Consult, no answer - left message with reason for call along with my name and contact number.

## 2019-11-23 NOTE — Telephone Encounter (Signed)
Called daughter back to let her know that we could schedule a Telehealth visit with the NP, no answer - unable to leave a message due to mailbox was full.

## 2019-11-23 NOTE — Telephone Encounter (Signed)
Rec'd return call from daughter regarding Palliative Consult.  After discussing Palliative services with daughter she was in agreement with scheduling a visit.  Daughter mentioned that the patient goes to [facility], so I told the daughter I would let my supervisor know that the patient will not be available for the Consult and that I would call her back after speaking with her and she was in agreement with this.

## 2019-11-25 ENCOUNTER — Telehealth: Payer: Self-pay | Admitting: Adult Health Nurse Practitioner

## 2019-11-25 NOTE — Telephone Encounter (Signed)
Spoke with the daughter, Threasa Beards, and have scheduled a Telephone Palliative Consult for 11/29/19 @ 10:30 AM

## 2019-11-29 ENCOUNTER — Other Ambulatory Visit: Payer: Self-pay

## 2019-11-29 ENCOUNTER — Other Ambulatory Visit: Payer: Self-pay | Admitting: Adult Health Nurse Practitioner

## 2019-11-29 DIAGNOSIS — Z515 Encounter for palliative care: Secondary | ICD-10-CM | POA: Diagnosis not present

## 2019-11-29 DIAGNOSIS — F0280 Dementia in other diseases classified elsewhere without behavioral disturbance: Secondary | ICD-10-CM

## 2019-11-29 NOTE — Progress Notes (Signed)
[facility] Consult Note Telephone: [phone]  Fax: [phone]  PATIENT NAME: Kayla Garner DOB: 09/20/1945 MRN: [id number]  PRIMARY CARE PROVIDER:   Darcus Austin, MD (Inactive)  REFERRING PROVIDER:  Shirline Frees, MD [place],   [id number]  RESPONSIBLE PARTY:   Clayborne Dana, daughter [phone]     Due to the COVID-19 crisis, this visit was done via telemedicine and it was initiated and consent by this patient and or family. Video-audio (telehealth) contact was unable to be done due to technical barriers from the patient's side.  RECOMMENDATIONS and PLAN:  1.  Spoke with patient's daughter today via phone.  Discussed palliative services in setting of advancing chronic disease.  Attempted to answer daughter's questions.  Also discussed palliative versus hospice services.  Daughter would like to move on with palliative services.  2.  Dementia.  Daughter states that her mother is shuffling her feet more when she walks and does not always understand when being prompted to move 1 foot in front of the other.  States that she is using wheelchair more often.  Does require assistance with standing up and can take a few small steps.  Does require assistance with ADLs.  Daughter's main concern is getting her in and out of the bathtub.  Patient is incontinent.  Daughter does try to take her to the bathroom frequently throughout the day.  Daughter states that her mother is eating less and that she has been losing weight.  She has to be prompted more during meals.  Patient's baseline weight was 134 pounds.  Daughter states as of 3 weeks ago her weight is 128 pounds.  3.  Support.  Daughter takes her mother to adult daycare at [facility] from 9-4 5 days a week.  States that last week has a caregiver who comes in the mornings to help get her ready for the daycare and comes in the afternoons to help when she gets back from  daycare.  States that the caregiver will help give her a sponge bath in the mornings.  States that her mother has not allowed the new caregiver to give her a shower or bath as of yet.  May take time for patient to get comfortable with new caregiver.  Palliative will continue to monitor for symptom management/decline and make recommendations as needed seeing this patient for another provider and recommend follow-up visit in 2 to 4 weeks for more thorough assessment in person.  I spent 30 minutes providing this consultation,  from 10:30 to 11:00 including time with patient/family, chart review, provider coordination, documentation. More than 50% of the time in this consultation was spent coordinating communication.   HISTORY OF PRESENT ILLNESS:  ALIYANNA Garner is a 74 y.o. year old female with multiple medical problems including Alzheimer's dementia, HTN, HLD, depression, recurrent UTIs. Palliative Care was asked to help address goals of care.   CODE STATUS:   PPS: 40% HOSPICE ELIGIBILITY/DIAGNOSIS: TBD   PHYSICAL EXAM:   Deferred   PAST MEDICAL HISTORY:  Past Medical History:  Diagnosis Date  . Hypercholesteremia   . Hypertension   . Pinched nerve in neck   . Squamous cell carcinoma of skin 04/23/2011   Right forehead - CX3 + 5FU    SOCIAL HX:  Social History   Tobacco Use  . Smoking status: Never Smoker  Substance Use Topics  . Alcohol use: Not on file    ALLERGIES: No Known  Allergies   PERTINENT MEDICATIONS:  Outpatient Encounter Medications as of 11/29/2019  Medication Sig  . amLODipine (NORVASC) 5 MG tablet Take 5 mg by mouth daily.  . Ascorbic Acid (VITAMIN C) 1000 MG tablet Take 1,000 mg by mouth daily.  Marland Kitchen aspirin 81 MG tablet Take 81 mg by mouth daily.  . cetirizine (ZYRTEC) 10 MG tablet Take 10 mg by mouth daily.  . Cholecalciferol (VITAMIN D3) 2000 UNITS TABS Take 1 tablet by mouth daily.  . Coenzyme Q10 (CO Q-10) 200 MG CAPS Take 1 capsule by mouth daily.  .  cyclobenzaprine (FLEXERIL) 10 MG tablet Take 1 tablet (10 mg total) by mouth 3 (three) times daily as needed for muscle spasms.  Marland Kitchen DHEA 25 MG CAPS Take 1 capsule by mouth.  . diphenhydrAMINE (BENADRYL) 25 mg capsule Take 25 mg by mouth every 6 (six) hours as needed.  Javier Docker Oil (MAXIMUM RED KRILL PO) Take 500 mg by mouth daily.  Marland Kitchen loratadine (CLARITIN) 10 MG tablet Take 10 mg by mouth daily.  Marland Kitchen lovastatin (MEVACOR) 20 MG tablet Take 20 mg by mouth at bedtime.  . metoprolol tartrate (LOPRESSOR) 25 MG tablet Take 25 mg by mouth 2 (two) times daily.  . Multiple Vitamins-Minerals (CENTRUM SILVER ULTRA WOMENS PO) Take 1 tablet by mouth daily.  . simethicone (MYLICON) 709 MG chewable tablet Chew 125 mg by mouth every 6 (six) hours as needed for flatulence.   No facility-administered encounter medications on file as of 11/29/2019.    Jguadalupe Opiela Jenetta Downer, NP

## 2019-12-07 ENCOUNTER — Other Ambulatory Visit: Payer: Self-pay

## 2019-12-07 ENCOUNTER — Emergency Department (HOSPITAL_BASED_OUTPATIENT_CLINIC_OR_DEPARTMENT_OTHER): Payer: Medicare HMO

## 2019-12-07 DIAGNOSIS — N390 Urinary tract infection, site not specified: Secondary | ICD-10-CM | POA: Insufficient documentation

## 2019-12-07 DIAGNOSIS — I10 Essential (primary) hypertension: Secondary | ICD-10-CM | POA: Insufficient documentation

## 2019-12-07 DIAGNOSIS — Z79899 Other long term (current) drug therapy: Secondary | ICD-10-CM | POA: Diagnosis not present

## 2019-12-07 DIAGNOSIS — R5383 Other fatigue: Secondary | ICD-10-CM | POA: Diagnosis not present

## 2019-12-07 LAB — CBC WITH DIFFERENTIAL/PLATELET
Abs Immature Granulocytes: 0.02 10*3/uL (ref 0.00–0.07)
Basophils Absolute: 0 10*3/uL (ref 0.0–0.1)
Basophils Relative: 0 %
Eosinophils Absolute: 0.1 10*3/uL (ref 0.0–0.5)
Eosinophils Relative: 1 %
HCT: 43.9 % (ref 36.0–46.0)
Hemoglobin: 14 g/dL (ref 12.0–15.0)
Immature Granulocytes: 0 %
Lymphocytes Relative: 16 %
Lymphs Abs: 1.1 10*3/uL (ref 0.7–4.0)
MCH: 28.6 pg (ref 26.0–34.0)
MCHC: 31.9 g/dL (ref 30.0–36.0)
MCV: 89.8 fL (ref 80.0–100.0)
Monocytes Absolute: 0.6 10*3/uL (ref 0.1–1.0)
Monocytes Relative: 8 %
Neutro Abs: 5.4 10*3/uL (ref 1.7–7.7)
Neutrophils Relative %: 75 %
Platelets: 258 10*3/uL (ref 150–400)
RBC: 4.89 MIL/uL (ref 3.87–5.11)
RDW: 13.6 % (ref 11.5–15.5)
WBC: 7.2 10*3/uL (ref 4.0–10.5)
nRBC: 0 % (ref 0.0–0.2)

## 2019-12-07 LAB — COMPREHENSIVE METABOLIC PANEL
ALT: 26 U/L (ref 0–44)
AST: 22 U/L (ref 15–41)
Albumin: 3.6 g/dL (ref 3.5–5.0)
Alkaline Phosphatase: 116 U/L (ref 38–126)
Anion gap: 9 (ref 5–15)
BUN: 17 mg/dL (ref 8–23)
CO2: 25 mmol/L (ref 22–32)
Calcium: 9.5 mg/dL (ref 8.9–10.3)
Chloride: 101 mmol/L (ref 98–111)
Creatinine, Ser: 0.7 mg/dL (ref 0.44–1.00)
GFR calc Af Amer: >60 mL/min (ref >60)
GFR calc non Af Amer: >60 mL/min (ref >60)
Glucose, Bld: 113 mg/dL — ABNORMAL HIGH (ref 70–99)
Potassium: 4.5 mmol/L (ref 3.5–5.1)
Sodium: 135 mmol/L (ref 135–145)
Total Bilirubin: 0.2 mg/dL — ABNORMAL LOW (ref 0.3–1.2)
Total Protein: 7.2 g/dL (ref 6.5–8.1)

## 2019-12-07 LAB — URINALYSIS, ROUTINE W REFLEX MICROSCOPIC
Bilirubin Urine: NEGATIVE
Glucose, UA: NEGATIVE mg/dL
Ketones, ur: NEGATIVE mg/dL
Nitrite: NEGATIVE
Protein, ur: NEGATIVE mg/dL
Specific Gravity, Urine: 1.01 (ref 1.005–1.030)
pH: 6.5 (ref 5.0–8.0)

## 2019-12-07 LAB — URINALYSIS, MICROSCOPIC (REFLEX)

## 2019-12-07 NOTE — ED Triage Notes (Signed)
Daughter states generalized fatigue x 6 hrs .

## 2019-12-08 ENCOUNTER — Emergency Department (HOSPITAL_BASED_OUTPATIENT_CLINIC_OR_DEPARTMENT_OTHER)
Admission: EM | Admit: 2019-12-08 | Discharge: 2019-12-08 | Disposition: A | Discharge status: Home / Self Care | Payer: Medicare HMO | Attending: Emergency Medicine | Admitting: Emergency Medicine

## 2019-12-08 DIAGNOSIS — N390 Urinary tract infection, site not specified: Secondary | ICD-10-CM

## 2019-12-08 MED ORDER — GUAIFENESIN 100 MG/5ML PO SOLN
ORAL | Status: AC
  Filled 2019-12-08: qty 10

## 2019-12-08 MED ORDER — GUAIFENESIN 100 MG/5ML PO SOLN
10.0000 mL | Freq: Once | ORAL | Status: AC
  Administered 2019-12-08: 200 mg via ORAL

## 2019-12-08 MED ORDER — CEPHALEXIN 250 MG PO CAPS
500.0000 mg | ORAL_CAPSULE | Freq: Once | ORAL | Status: AC
  Administered 2019-12-08: 500 mg via ORAL
  Filled 2019-12-08: qty 2

## 2019-12-08 MED ORDER — CEPHALEXIN 500 MG PO CAPS: 500.0000 mg | ORAL_CAPSULE | Freq: Four times a day (QID) | ORAL | 0 refills | Status: AC

## 2019-12-08 NOTE — ED Notes (Signed)
ED Provider at bedside. 

## 2019-12-08 NOTE — ED Notes (Signed)
Attempted to get pt vitals but pt family member states pt had her vitals checked 30 min ago

## 2019-12-08 NOTE — ED Provider Notes (Signed)
[facility] EMERGENCY DEPARTMENT Provider Note   CSN: [id number] Arrival date & time: 12/07/19  1922     History Chief Complaint  Patient presents with  . Fatigue    Kayla Garner is a 74 y.o. female.  The history is provided by a relative. The history is limited by the condition of the patient (level 5 caveat dementia ).  Illness Location:  Body  Quality:  Fatigue  Severity:  Mild Onset quality:  Gradual Duration:  1 day Timing:  Constant Progression:  Unchanged Chronicity:  New Context:  Dementia  Relieved by:  Nothing  Worsened by:  Nothing  Ineffective treatments:  None Associated symptoms: fatigue   Associated symptoms: no abdominal pain, no chest pain, no congestion, no diarrhea, no ear pain, no fever, no headaches, no loss of consciousness, no myalgias, no nausea, no rash, no rhinorrhea, no shortness of breath, no sore throat, no vomiting and no wheezing   Risk factors:  Dementia       Past Medical History:  Diagnosis Date  . Hypercholesteremia   . Hypertension   . Pinched nerve in neck   . Squamous cell carcinoma of skin 04/23/2011   Right forehead - CX3 + 5FU    Patient Active Problem List   Diagnosis Date Noted  . Syncope 11/24/2016    Past Surgical History:  Procedure Laterality Date  . NECK SURGERY    . titanium plate and screws in neck       OB History   No obstetric history on file.     No family history on file.  Social History   Tobacco Use  . Smoking status: Never Smoker  Substance Use Topics  . Alcohol use: Not on file  . Drug use: Not on file    Home Medications Prior to Admission medications   Medication Sig Start Date End Date Taking? Authorizing Provider  amLODipine (NORVASC) 5 MG tablet Take 5 mg by mouth daily.    [provider]  Ascorbic Acid (VITAMIN C) 1000 MG tablet Take 1,000 mg by mouth daily.    [provider]  aspirin 81 MG tablet Take 81 mg by mouth daily.    [provider]  cetirizine (ZYRTEC) 10 MG tablet Take 10 mg by mouth daily.    [provider]  Cholecalciferol (VITAMIN D3) 2000 UNITS TABS Take 1 tablet by mouth daily.    [provider]  Coenzyme Q10 (CO Q-10) 200 MG CAPS Take 1 capsule by mouth daily.    [provider]  cyclobenzaprine (FLEXERIL) 10 MG tablet Take 1 tablet (10 mg total) by mouth 3 (three) times daily as needed for muscle spasms. 03/14/13   Veryl Speak, MD  DHEA 25 MG CAPS Take 1 capsule by mouth.    [provider]  diphenhydrAMINE (BENADRYL) 25 mg capsule Take 25 mg by mouth every 6 (six) hours as needed.    [provider]  Astrid Drafts (MAXIMUM RED KRILL PO) Take 500 mg by mouth daily.    [provider]  loratadine (CLARITIN) 10 MG tablet Take 10 mg by mouth daily.    [provider]  lovastatin (MEVACOR) 20 MG tablet Take 20 mg by mouth at bedtime.    [provider]  metoprolol tartrate (LOPRESSOR) 25 MG tablet Take 25 mg by mouth 2 (two) times daily.    [provider]  Multiple Vitamins-Minerals (CENTRUM SILVER ULTRA WOMENS PO) Take 1 tablet by mouth daily.  [provider]  simethicone (MYLICON) 086 MG chewable tablet Chew 125 mg by mouth every 6 (six) hours as needed for flatulence.    [provider]    Allergies    Patient has no known allergies.  Review of Systems   Review of Systems  Unable to perform ROS: Dementia  Constitutional: Positive for fatigue. Negative for fever.  HENT: Negative for congestion, ear pain, rhinorrhea and sore throat.   Eyes: Negative for visual disturbance.  Respiratory: Negative for shortness of breath and wheezing.   Cardiovascular: Negative for chest pain.  Gastrointestinal: Negative for abdominal pain, diarrhea, nausea and vomiting.  Genitourinary: Negative for difficulty urinating.  Musculoskeletal: Negative for myalgias.  Skin: Negative for rash.  Neurological: Negative  for loss of consciousness and headaches.  Psychiatric/Behavioral: Negative for agitation.  All other systems reviewed and are negative.   Physical Exam Updated Vital Signs BP 127/62   Pulse 83   Temp 98.5 F (36.9 C) (Oral)   Resp 18   Ht 5\' 3"  (1.6 m)   Wt 58.1 kg   SpO2 96%   BMI 22.67 kg/m   Physical Exam Vitals and nursing note reviewed.  Constitutional:      General: She is not in acute distress.    Appearance: Normal appearance.  HENT:     Head: Normocephalic and atraumatic.     Nose: Nose normal.  Eyes:     Conjunctiva/sclera: Conjunctivae normal.     Pupils: Pupils are equal, round, and reactive to light.  Cardiovascular:     Rate and Rhythm: Normal rate and regular rhythm.     Pulses: Normal pulses.     Heart sounds: Normal heart sounds.  Pulmonary:     Effort: Pulmonary effort is normal.     Breath sounds: Normal breath sounds.  Abdominal:     General: Abdomen is flat. Bowel sounds are normal.     Palpations: Abdomen is soft.     Tenderness: There is no abdominal tenderness. There is no guarding.  Musculoskeletal:        General: Normal range of motion.     Cervical back: Normal range of motion and neck supple.  Skin:    General: Skin is warm and dry.     Capillary Refill: Capillary refill takes less than 2 seconds.  Neurological:     General: No focal deficit present.     Mental Status: She is alert.     Deep Tendon Reflexes: Reflexes normal.  Psychiatric:        Mood and Affect: Mood normal.     ED Results / Procedures / Treatments   Labs (all labs ordered are listed, but only abnormal results are displayed) Results for orders placed or performed during the hospital encounter of 12/08/19  Urinalysis, Routine w reflex microscopic Urine, Clean Catch  Result Value Ref Range   Color, Urine YELLOW YELLOW   APPearance CLEAR CLEAR   Specific Gravity, Urine 1.010 1.005 - 1.030   pH 6.5 5.0 - 8.0   Glucose, UA NEGATIVE NEGATIVE mg/dL   Hgb urine  dipstick TRACE (A) NEGATIVE   Bilirubin Urine NEGATIVE NEGATIVE   Ketones, ur NEGATIVE NEGATIVE mg/dL   Protein, ur NEGATIVE NEGATIVE mg/dL   Nitrite NEGATIVE NEGATIVE   Leukocytes,Ua MODERATE (A) NEGATIVE  CBC with Differential  Result Value Ref Range   WBC 7.2 4.0 - 10.5 K/uL   RBC 4.89 3.87 - 5.11 MIL/uL   Hemoglobin 14.0 12.0 - 15.0 g/dL  HCT 43.9 36 - 46 %   MCV 89.8 80.0 - 100.0 fL   MCH 28.6 26.0 - 34.0 pg   MCHC 31.9 30.0 - 36.0 g/dL   RDW 13.6 11.5 - 15.5 %   Platelets 258 150 - 400 K/uL   nRBC 0.0 0.0 - 0.2 %   Neutrophils Relative % 75 %   Neutro Abs 5.4 1.7 - 7.7 K/uL   Lymphocytes Relative 16 %   Lymphs Abs 1.1 0.7 - 4.0 K/uL   Monocytes Relative 8 %   Monocytes Absolute 0.6 0 - 1 K/uL   Eosinophils Relative 1 %   Eosinophils Absolute 0.1 0 - 0 K/uL   Basophils Relative 0 %   Basophils Absolute 0.0 0 - 0 K/uL   Immature Granulocytes 0 %   Abs Immature Granulocytes 0.02 0.00 - 0.07 K/uL  Comprehensive metabolic panel  Result Value Ref Range   Sodium 135 135 - 145 mmol/L   Potassium 4.5 3.5 - 5.1 mmol/L   Chloride 101 98 - 111 mmol/L   CO2 25 22 - 32 mmol/L   Glucose, Bld 113 (H) 70 - 99 mg/dL   BUN 17 8 - 23 mg/dL   Creatinine, Ser 0.70 0.44 - 1.00 mg/dL   Calcium 9.5 8.9 - 10.3 mg/dL   Total Protein 7.2 6.5 - 8.1 g/dL   Albumin 3.6 3.5 - 5.0 g/dL   AST 22 15 - 41 U/L   ALT 26 0 - 44 U/L   Alkaline Phosphatase 116 38 - 126 U/L   Total Bilirubin 0.2 (L) 0.3 - 1.2 mg/dL   GFR calc non Af Amer >60 >60 mL/min   GFR calc Af Amer >60 >60 mL/min   Anion gap 9 5 - 15  Urinalysis, Microscopic (reflex)  Result Value Ref Range   RBC / HPF 0-5 0 - 5 RBC/hpf   WBC, UA 11-20 0 - 5 WBC/hpf   Bacteria, UA FEW (A) NONE SEEN   Squamous Epithelial / LPF 6-10 0 - 5   DG Chest 2 View  Result Date: 12/07/2019 CLINICAL DATA:  Generalized fatigue for 6 hours, hypertension EXAM: CHEST - 2 VIEW COMPARISON:  10/24/2003 FINDINGS: The heart size and mediastinal contours are  within normal limits. Both lungs are clear. The visualized skeletal structures are unremarkable. IMPRESSION: No active cardiopulmonary disease. Electronically Signed   By: Randa Ngo M.D.   On: 12/07/2019 20:08    Radiology DG Chest 2 View  Result Date: 12/07/2019 CLINICAL DATA:  Generalized fatigue for 6 hours, hypertension EXAM: CHEST - 2 VIEW COMPARISON:  10/24/2003 FINDINGS: The heart size and mediastinal contours are within normal limits. Both lungs are clear. The visualized skeletal structures are unremarkable. IMPRESSION: No active cardiopulmonary disease. Electronically Signed   By: Randa Ngo M.D.   On: 12/07/2019 20:08    Procedures Procedures (including critical care time)  Medications Ordered in ED Medications  cephALEXin (KEFLEX) capsule 500 mg (has no administration in time range)    ED Course  I have reviewed the triage vital signs and the nursing notes.  Pertinent labs & imaging results that were available during my care of the patient were reviewed by me and considered in my medical decision making (see chart for details).  Patient has a UTi, urine cultured and keflex initiated.  No signs of sepsis.  Follow up with your family doctor for ongoing care.  Strict return precautions given.    Kayla Garner was evaluated in Emergency Department on  12/08/2019 for the symptoms described in the history of present illness. She was evaluated in the context of the global COVID-19 pandemic, which necessitated consideration that the patient might be at risk for infection with the SARS-CoV-2 virus that causes COVID-19. Institutional protocols and algorithms that pertain to the evaluation of patients at risk for COVID-19 are in a state of rapid change based on information released by regulatory bodies including the CDC and federal and state organizations. These policies and algorithms were followed during the patient's care in the ED.  Final Clinical Impression(s) / ED Diagnoses   Return for intractable cough, coughing up blood,fevers >100.4 unrelieved by medication, shortness of breath, intractable vomiting, chest pain, shortness of breath, weakness,numbness, changes in speech, facial asymmetry,abdominal pain, passing out,Inability to tolerate liquids or food, cough, altered mental status or any concerns. No signs of systemic illness or infection. The patient is nontoxic-appearing on exam and vital signs are within normal limits.   I have reviewed the triage vital signs and the nursing notes. Pertinent labs &imaging results that were available during my care of the patient were reviewed by me and considered in my medical decision making (see chart for details).After history, exam, and medical workup I feel the patient has beenappropriately medically screened and is safe for discharge home. Pertinent diagnoses were discussed with the patient. Patient was given return precautions.      Delta Deshmukh, MD 12/08/19 [phone]

## 2019-12-09 DIAGNOSIS — N3000 Acute cystitis without hematuria: Secondary | ICD-10-CM | POA: Diagnosis not present

## 2019-12-09 DIAGNOSIS — R05 Cough: Secondary | ICD-10-CM | POA: Diagnosis not present

## 2019-12-09 LAB — URINE CULTURE: Special Requests: NORMAL

## 2019-12-31 DIAGNOSIS — Z111 Encounter for screening for respiratory tuberculosis: Secondary | ICD-10-CM | POA: Diagnosis not present

## 2020-01-13 DIAGNOSIS — N390 Urinary tract infection, site not specified: Secondary | ICD-10-CM | POA: Diagnosis not present

## 2020-01-17 DIAGNOSIS — Z0000 Encounter for general adult medical examination without abnormal findings: Secondary | ICD-10-CM | POA: Diagnosis not present

## 2020-01-17 DIAGNOSIS — F0390 Unspecified dementia without behavioral disturbance: Secondary | ICD-10-CM | POA: Diagnosis not present

## 2020-01-17 DIAGNOSIS — I10 Essential (primary) hypertension: Secondary | ICD-10-CM | POA: Diagnosis not present

## 2020-01-17 DIAGNOSIS — F5101 Primary insomnia: Secondary | ICD-10-CM | POA: Diagnosis not present

## 2020-01-17 DIAGNOSIS — E7800 Pure hypercholesterolemia, unspecified: Secondary | ICD-10-CM | POA: Diagnosis not present

## 2020-01-17 DIAGNOSIS — F324 Major depressive disorder, single episode, in partial remission: Secondary | ICD-10-CM | POA: Diagnosis not present

## 2020-01-18 DIAGNOSIS — R2681 Unsteadiness on feet: Secondary | ICD-10-CM | POA: Diagnosis not present

## 2020-01-18 DIAGNOSIS — R2689 Other abnormalities of gait and mobility: Secondary | ICD-10-CM | POA: Diagnosis not present

## 2020-01-18 DIAGNOSIS — R296 Repeated falls: Secondary | ICD-10-CM | POA: Diagnosis not present

## 2020-01-19 DIAGNOSIS — R2689 Other abnormalities of gait and mobility: Secondary | ICD-10-CM | POA: Diagnosis not present

## 2020-01-19 DIAGNOSIS — R296 Repeated falls: Secondary | ICD-10-CM | POA: Diagnosis not present

## 2020-01-19 DIAGNOSIS — R2681 Unsteadiness on feet: Secondary | ICD-10-CM | POA: Diagnosis not present

## 2020-01-21 DIAGNOSIS — R2681 Unsteadiness on feet: Secondary | ICD-10-CM | POA: Diagnosis not present

## 2020-01-21 DIAGNOSIS — R2689 Other abnormalities of gait and mobility: Secondary | ICD-10-CM | POA: Diagnosis not present

## 2020-01-21 DIAGNOSIS — R296 Repeated falls: Secondary | ICD-10-CM | POA: Diagnosis not present

## 2020-01-24 DIAGNOSIS — R296 Repeated falls: Secondary | ICD-10-CM | POA: Diagnosis not present

## 2020-01-24 DIAGNOSIS — R2681 Unsteadiness on feet: Secondary | ICD-10-CM | POA: Diagnosis not present

## 2020-01-24 DIAGNOSIS — R2689 Other abnormalities of gait and mobility: Secondary | ICD-10-CM | POA: Diagnosis not present

## 2020-01-26 DIAGNOSIS — R2689 Other abnormalities of gait and mobility: Secondary | ICD-10-CM | POA: Diagnosis not present

## 2020-01-26 DIAGNOSIS — R2681 Unsteadiness on feet: Secondary | ICD-10-CM | POA: Diagnosis not present

## 2020-01-26 DIAGNOSIS — R296 Repeated falls: Secondary | ICD-10-CM | POA: Diagnosis not present

## 2020-01-28 DIAGNOSIS — R296 Repeated falls: Secondary | ICD-10-CM | POA: Diagnosis not present

## 2020-01-28 DIAGNOSIS — R2681 Unsteadiness on feet: Secondary | ICD-10-CM | POA: Diagnosis not present

## 2020-01-28 DIAGNOSIS — R2689 Other abnormalities of gait and mobility: Secondary | ICD-10-CM | POA: Diagnosis not present

## 2020-01-31 DIAGNOSIS — R2689 Other abnormalities of gait and mobility: Secondary | ICD-10-CM | POA: Diagnosis not present

## 2020-01-31 DIAGNOSIS — R296 Repeated falls: Secondary | ICD-10-CM | POA: Diagnosis not present

## 2020-01-31 DIAGNOSIS — R2681 Unsteadiness on feet: Secondary | ICD-10-CM | POA: Diagnosis not present

## 2020-02-02 DIAGNOSIS — R296 Repeated falls: Secondary | ICD-10-CM | POA: Diagnosis not present

## 2020-02-02 DIAGNOSIS — R2681 Unsteadiness on feet: Secondary | ICD-10-CM | POA: Diagnosis not present

## 2020-02-02 DIAGNOSIS — R2689 Other abnormalities of gait and mobility: Secondary | ICD-10-CM | POA: Diagnosis not present

## 2020-02-04 DIAGNOSIS — R2689 Other abnormalities of gait and mobility: Secondary | ICD-10-CM | POA: Diagnosis not present

## 2020-02-04 DIAGNOSIS — R2681 Unsteadiness on feet: Secondary | ICD-10-CM | POA: Diagnosis not present

## 2020-02-04 DIAGNOSIS — R296 Repeated falls: Secondary | ICD-10-CM | POA: Diagnosis not present

## 2020-02-07 DIAGNOSIS — R2681 Unsteadiness on feet: Secondary | ICD-10-CM | POA: Diagnosis not present

## 2020-02-07 DIAGNOSIS — R2689 Other abnormalities of gait and mobility: Secondary | ICD-10-CM | POA: Diagnosis not present

## 2020-02-07 DIAGNOSIS — R296 Repeated falls: Secondary | ICD-10-CM | POA: Diagnosis not present

## 2020-02-08 DIAGNOSIS — R296 Repeated falls: Secondary | ICD-10-CM | POA: Diagnosis not present

## 2020-02-08 DIAGNOSIS — R2689 Other abnormalities of gait and mobility: Secondary | ICD-10-CM | POA: Diagnosis not present

## 2020-02-08 DIAGNOSIS — R2681 Unsteadiness on feet: Secondary | ICD-10-CM | POA: Diagnosis not present

## 2020-02-10 DIAGNOSIS — R2689 Other abnormalities of gait and mobility: Secondary | ICD-10-CM | POA: Diagnosis not present

## 2020-02-10 DIAGNOSIS — R296 Repeated falls: Secondary | ICD-10-CM | POA: Diagnosis not present

## 2020-02-10 DIAGNOSIS — R2681 Unsteadiness on feet: Secondary | ICD-10-CM | POA: Diagnosis not present

## 2020-02-11 DIAGNOSIS — R2681 Unsteadiness on feet: Secondary | ICD-10-CM | POA: Diagnosis not present

## 2020-02-11 DIAGNOSIS — R278 Other lack of coordination: Secondary | ICD-10-CM | POA: Diagnosis not present

## 2020-02-11 DIAGNOSIS — M6281 Muscle weakness (generalized): Secondary | ICD-10-CM | POA: Diagnosis not present

## 2020-02-11 DIAGNOSIS — R296 Repeated falls: Secondary | ICD-10-CM | POA: Diagnosis not present

## 2020-02-11 DIAGNOSIS — R2689 Other abnormalities of gait and mobility: Secondary | ICD-10-CM | POA: Diagnosis not present

## 2020-02-14 DIAGNOSIS — R278 Other lack of coordination: Secondary | ICD-10-CM | POA: Diagnosis not present

## 2020-02-14 DIAGNOSIS — M6281 Muscle weakness (generalized): Secondary | ICD-10-CM | POA: Diagnosis not present

## 2020-02-16 DIAGNOSIS — R2689 Other abnormalities of gait and mobility: Secondary | ICD-10-CM | POA: Diagnosis not present

## 2020-02-16 DIAGNOSIS — R296 Repeated falls: Secondary | ICD-10-CM | POA: Diagnosis not present

## 2020-02-16 DIAGNOSIS — R2681 Unsteadiness on feet: Secondary | ICD-10-CM | POA: Diagnosis not present

## 2020-02-18 DIAGNOSIS — R278 Other lack of coordination: Secondary | ICD-10-CM | POA: Diagnosis not present

## 2020-02-18 DIAGNOSIS — M6281 Muscle weakness (generalized): Secondary | ICD-10-CM | POA: Diagnosis not present

## 2020-02-21 DIAGNOSIS — R2681 Unsteadiness on feet: Secondary | ICD-10-CM | POA: Diagnosis not present

## 2020-02-21 DIAGNOSIS — R2689 Other abnormalities of gait and mobility: Secondary | ICD-10-CM | POA: Diagnosis not present

## 2020-02-21 DIAGNOSIS — R296 Repeated falls: Secondary | ICD-10-CM | POA: Diagnosis not present

## 2020-02-22 DIAGNOSIS — R2689 Other abnormalities of gait and mobility: Secondary | ICD-10-CM | POA: Diagnosis not present

## 2020-02-22 DIAGNOSIS — R296 Repeated falls: Secondary | ICD-10-CM | POA: Diagnosis not present

## 2020-02-22 DIAGNOSIS — R2681 Unsteadiness on feet: Secondary | ICD-10-CM | POA: Diagnosis not present

## 2020-02-23 DIAGNOSIS — R278 Other lack of coordination: Secondary | ICD-10-CM | POA: Diagnosis not present

## 2020-02-23 DIAGNOSIS — M6281 Muscle weakness (generalized): Secondary | ICD-10-CM | POA: Diagnosis not present

## 2020-02-24 DIAGNOSIS — N390 Urinary tract infection, site not specified: Secondary | ICD-10-CM | POA: Diagnosis not present

## 2020-02-24 DIAGNOSIS — R350 Frequency of micturition: Secondary | ICD-10-CM | POA: Diagnosis not present

## 2020-02-25 ENCOUNTER — Telehealth: Payer: Self-pay

## 2020-02-25 DIAGNOSIS — M6281 Muscle weakness (generalized): Secondary | ICD-10-CM | POA: Diagnosis not present

## 2020-02-25 DIAGNOSIS — R278 Other lack of coordination: Secondary | ICD-10-CM | POA: Diagnosis not present

## 2020-02-25 NOTE — Telephone Encounter (Signed)
Spoke with patient's daughter, Threasa Beards, to check in on patient. Threasa Beards shared that patient is now living at [facility]. Informed Threasa Beards that if she would like Palliative care to follow at the facility to inform Carla Drape, nurse at [facility]. Daughter expressed understanding. Palliative care will sign off at this time

## 2020-03-02 DIAGNOSIS — R278 Other lack of coordination: Secondary | ICD-10-CM | POA: Diagnosis not present

## 2020-03-02 DIAGNOSIS — M6281 Muscle weakness (generalized): Secondary | ICD-10-CM | POA: Diagnosis not present

## 2020-03-07 DIAGNOSIS — M6281 Muscle weakness (generalized): Secondary | ICD-10-CM | POA: Diagnosis not present

## 2020-03-07 DIAGNOSIS — R278 Other lack of coordination: Secondary | ICD-10-CM | POA: Diagnosis not present

## 2020-03-08 DIAGNOSIS — M6281 Muscle weakness (generalized): Secondary | ICD-10-CM | POA: Diagnosis not present

## 2020-03-08 DIAGNOSIS — R278 Other lack of coordination: Secondary | ICD-10-CM | POA: Diagnosis not present

## 2020-03-09 DIAGNOSIS — M6281 Muscle weakness (generalized): Secondary | ICD-10-CM | POA: Diagnosis not present

## 2020-03-09 DIAGNOSIS — R278 Other lack of coordination: Secondary | ICD-10-CM | POA: Diagnosis not present

## 2020-03-13 DIAGNOSIS — R278 Other lack of coordination: Secondary | ICD-10-CM | POA: Diagnosis not present

## 2020-03-13 DIAGNOSIS — M6281 Muscle weakness (generalized): Secondary | ICD-10-CM | POA: Diagnosis not present

## 2020-03-14 DIAGNOSIS — R278 Other lack of coordination: Secondary | ICD-10-CM | POA: Diagnosis not present

## 2020-03-14 DIAGNOSIS — M6281 Muscle weakness (generalized): Secondary | ICD-10-CM | POA: Diagnosis not present

## 2020-03-30 DIAGNOSIS — N390 Urinary tract infection, site not specified: Secondary | ICD-10-CM | POA: Diagnosis not present

## 2020-04-03 DIAGNOSIS — Z20828 Contact with and (suspected) exposure to other viral communicable diseases: Secondary | ICD-10-CM | POA: Diagnosis not present

## 2020-05-22 DIAGNOSIS — M6281 Muscle weakness (generalized): Secondary | ICD-10-CM | POA: Diagnosis not present

## 2020-05-22 DIAGNOSIS — R2681 Unsteadiness on feet: Secondary | ICD-10-CM | POA: Diagnosis not present

## 2020-05-22 DIAGNOSIS — N390 Urinary tract infection, site not specified: Secondary | ICD-10-CM | POA: Diagnosis not present

## 2020-05-25 DIAGNOSIS — R2681 Unsteadiness on feet: Secondary | ICD-10-CM | POA: Diagnosis not present

## 2020-05-25 DIAGNOSIS — N390 Urinary tract infection, site not specified: Secondary | ICD-10-CM | POA: Diagnosis not present

## 2020-05-25 DIAGNOSIS — M6281 Muscle weakness (generalized): Secondary | ICD-10-CM | POA: Diagnosis not present

## 2020-05-26 DIAGNOSIS — M6281 Muscle weakness (generalized): Secondary | ICD-10-CM | POA: Diagnosis not present

## 2020-05-26 DIAGNOSIS — N390 Urinary tract infection, site not specified: Secondary | ICD-10-CM | POA: Diagnosis not present

## 2020-05-26 DIAGNOSIS — R2681 Unsteadiness on feet: Secondary | ICD-10-CM | POA: Diagnosis not present

## 2020-05-30 DIAGNOSIS — M6281 Muscle weakness (generalized): Secondary | ICD-10-CM | POA: Diagnosis not present

## 2020-05-30 DIAGNOSIS — N390 Urinary tract infection, site not specified: Secondary | ICD-10-CM | POA: Diagnosis not present

## 2020-05-30 DIAGNOSIS — R2681 Unsteadiness on feet: Secondary | ICD-10-CM | POA: Diagnosis not present

## 2020-05-31 DIAGNOSIS — R2681 Unsteadiness on feet: Secondary | ICD-10-CM | POA: Diagnosis not present

## 2020-05-31 DIAGNOSIS — M6281 Muscle weakness (generalized): Secondary | ICD-10-CM | POA: Diagnosis not present

## 2020-05-31 DIAGNOSIS — N390 Urinary tract infection, site not specified: Secondary | ICD-10-CM | POA: Diagnosis not present

## 2020-06-02 DIAGNOSIS — N390 Urinary tract infection, site not specified: Secondary | ICD-10-CM | POA: Diagnosis not present

## 2020-06-02 DIAGNOSIS — M6281 Muscle weakness (generalized): Secondary | ICD-10-CM | POA: Diagnosis not present

## 2020-06-02 DIAGNOSIS — R2681 Unsteadiness on feet: Secondary | ICD-10-CM | POA: Diagnosis not present

## 2020-06-05 DIAGNOSIS — R2681 Unsteadiness on feet: Secondary | ICD-10-CM | POA: Diagnosis not present

## 2020-06-05 DIAGNOSIS — N390 Urinary tract infection, site not specified: Secondary | ICD-10-CM | POA: Diagnosis not present

## 2020-06-05 DIAGNOSIS — M6281 Muscle weakness (generalized): Secondary | ICD-10-CM | POA: Diagnosis not present

## 2020-06-07 DIAGNOSIS — M6281 Muscle weakness (generalized): Secondary | ICD-10-CM | POA: Diagnosis not present

## 2020-06-07 DIAGNOSIS — R2681 Unsteadiness on feet: Secondary | ICD-10-CM | POA: Diagnosis not present

## 2020-06-07 DIAGNOSIS — N390 Urinary tract infection, site not specified: Secondary | ICD-10-CM | POA: Diagnosis not present

## 2020-06-09 DIAGNOSIS — R2681 Unsteadiness on feet: Secondary | ICD-10-CM | POA: Diagnosis not present

## 2020-06-09 DIAGNOSIS — M6281 Muscle weakness (generalized): Secondary | ICD-10-CM | POA: Diagnosis not present

## 2020-06-09 DIAGNOSIS — N390 Urinary tract infection, site not specified: Secondary | ICD-10-CM | POA: Diagnosis not present

## 2020-06-12 DIAGNOSIS — N390 Urinary tract infection, site not specified: Secondary | ICD-10-CM | POA: Diagnosis not present

## 2020-06-12 DIAGNOSIS — R2681 Unsteadiness on feet: Secondary | ICD-10-CM | POA: Diagnosis not present

## 2020-06-12 DIAGNOSIS — M6281 Muscle weakness (generalized): Secondary | ICD-10-CM | POA: Diagnosis not present

## 2020-06-15 DIAGNOSIS — M6281 Muscle weakness (generalized): Secondary | ICD-10-CM | POA: Diagnosis not present

## 2020-06-15 DIAGNOSIS — N390 Urinary tract infection, site not specified: Secondary | ICD-10-CM | POA: Diagnosis not present

## 2020-06-15 DIAGNOSIS — R2681 Unsteadiness on feet: Secondary | ICD-10-CM | POA: Diagnosis not present

## 2020-06-16 DIAGNOSIS — M6281 Muscle weakness (generalized): Secondary | ICD-10-CM | POA: Diagnosis not present

## 2020-06-16 DIAGNOSIS — R2681 Unsteadiness on feet: Secondary | ICD-10-CM | POA: Diagnosis not present

## 2020-06-16 DIAGNOSIS — N390 Urinary tract infection, site not specified: Secondary | ICD-10-CM | POA: Diagnosis not present

## 2020-06-21 DIAGNOSIS — M6281 Muscle weakness (generalized): Secondary | ICD-10-CM | POA: Diagnosis not present

## 2020-06-21 DIAGNOSIS — N390 Urinary tract infection, site not specified: Secondary | ICD-10-CM | POA: Diagnosis not present

## 2020-06-21 DIAGNOSIS — R2681 Unsteadiness on feet: Secondary | ICD-10-CM | POA: Diagnosis not present

## 2020-06-22 DIAGNOSIS — N390 Urinary tract infection, site not specified: Secondary | ICD-10-CM | POA: Diagnosis not present

## 2020-06-22 DIAGNOSIS — R2681 Unsteadiness on feet: Secondary | ICD-10-CM | POA: Diagnosis not present

## 2020-06-22 DIAGNOSIS — M6281 Muscle weakness (generalized): Secondary | ICD-10-CM | POA: Diagnosis not present

## 2020-06-23 DIAGNOSIS — R2681 Unsteadiness on feet: Secondary | ICD-10-CM | POA: Diagnosis not present

## 2020-06-23 DIAGNOSIS — N390 Urinary tract infection, site not specified: Secondary | ICD-10-CM | POA: Diagnosis not present

## 2020-06-23 DIAGNOSIS — M6281 Muscle weakness (generalized): Secondary | ICD-10-CM | POA: Diagnosis not present

## 2020-06-26 DIAGNOSIS — N390 Urinary tract infection, site not specified: Secondary | ICD-10-CM | POA: Diagnosis not present

## 2020-06-26 DIAGNOSIS — M6281 Muscle weakness (generalized): Secondary | ICD-10-CM | POA: Diagnosis not present

## 2020-06-26 DIAGNOSIS — R2681 Unsteadiness on feet: Secondary | ICD-10-CM | POA: Diagnosis not present

## 2020-06-28 DIAGNOSIS — M6281 Muscle weakness (generalized): Secondary | ICD-10-CM | POA: Diagnosis not present

## 2020-06-28 DIAGNOSIS — N390 Urinary tract infection, site not specified: Secondary | ICD-10-CM | POA: Diagnosis not present

## 2020-06-28 DIAGNOSIS — R2681 Unsteadiness on feet: Secondary | ICD-10-CM | POA: Diagnosis not present

## 2020-06-30 DIAGNOSIS — R2681 Unsteadiness on feet: Secondary | ICD-10-CM | POA: Diagnosis not present

## 2020-06-30 DIAGNOSIS — N390 Urinary tract infection, site not specified: Secondary | ICD-10-CM | POA: Diagnosis not present

## 2020-06-30 DIAGNOSIS — M6281 Muscle weakness (generalized): Secondary | ICD-10-CM | POA: Diagnosis not present

## 2020-07-03 DIAGNOSIS — M6281 Muscle weakness (generalized): Secondary | ICD-10-CM | POA: Diagnosis not present

## 2020-07-03 DIAGNOSIS — N390 Urinary tract infection, site not specified: Secondary | ICD-10-CM | POA: Diagnosis not present

## 2020-07-03 DIAGNOSIS — R2681 Unsteadiness on feet: Secondary | ICD-10-CM | POA: Diagnosis not present

## 2020-07-05 DIAGNOSIS — R2681 Unsteadiness on feet: Secondary | ICD-10-CM | POA: Diagnosis not present

## 2020-07-05 DIAGNOSIS — M6281 Muscle weakness (generalized): Secondary | ICD-10-CM | POA: Diagnosis not present

## 2020-07-05 DIAGNOSIS — N390 Urinary tract infection, site not specified: Secondary | ICD-10-CM | POA: Diagnosis not present

## 2020-07-07 DIAGNOSIS — M6281 Muscle weakness (generalized): Secondary | ICD-10-CM | POA: Diagnosis not present

## 2020-07-07 DIAGNOSIS — N390 Urinary tract infection, site not specified: Secondary | ICD-10-CM | POA: Diagnosis not present

## 2020-07-07 DIAGNOSIS — R2681 Unsteadiness on feet: Secondary | ICD-10-CM | POA: Diagnosis not present

## 2020-07-10 DIAGNOSIS — M6281 Muscle weakness (generalized): Secondary | ICD-10-CM | POA: Diagnosis not present

## 2020-07-10 DIAGNOSIS — N390 Urinary tract infection, site not specified: Secondary | ICD-10-CM | POA: Diagnosis not present

## 2020-07-10 DIAGNOSIS — R2681 Unsteadiness on feet: Secondary | ICD-10-CM | POA: Diagnosis not present

## 2020-07-12 DIAGNOSIS — M6281 Muscle weakness (generalized): Secondary | ICD-10-CM | POA: Diagnosis not present

## 2020-07-12 DIAGNOSIS — N390 Urinary tract infection, site not specified: Secondary | ICD-10-CM | POA: Diagnosis not present

## 2020-07-12 DIAGNOSIS — R2681 Unsteadiness on feet: Secondary | ICD-10-CM | POA: Diagnosis not present

## 2020-07-14 DIAGNOSIS — N390 Urinary tract infection, site not specified: Secondary | ICD-10-CM | POA: Diagnosis not present

## 2020-07-14 DIAGNOSIS — M6281 Muscle weakness (generalized): Secondary | ICD-10-CM | POA: Diagnosis not present

## 2020-07-14 DIAGNOSIS — R2681 Unsteadiness on feet: Secondary | ICD-10-CM | POA: Diagnosis not present

## 2020-07-17 DIAGNOSIS — I10 Essential (primary) hypertension: Secondary | ICD-10-CM | POA: Diagnosis not present

## 2020-07-17 DIAGNOSIS — R2681 Unsteadiness on feet: Secondary | ICD-10-CM | POA: Diagnosis not present

## 2020-07-17 DIAGNOSIS — F5101 Primary insomnia: Secondary | ICD-10-CM | POA: Diagnosis not present

## 2020-07-17 DIAGNOSIS — G301 Alzheimer's disease with late onset: Secondary | ICD-10-CM | POA: Diagnosis not present

## 2020-07-17 DIAGNOSIS — M6281 Muscle weakness (generalized): Secondary | ICD-10-CM | POA: Diagnosis not present

## 2020-07-17 DIAGNOSIS — E7800 Pure hypercholesterolemia, unspecified: Secondary | ICD-10-CM | POA: Diagnosis not present

## 2020-07-17 DIAGNOSIS — F324 Major depressive disorder, single episode, in partial remission: Secondary | ICD-10-CM | POA: Diagnosis not present

## 2020-07-17 DIAGNOSIS — N390 Urinary tract infection, site not specified: Secondary | ICD-10-CM | POA: Diagnosis not present

## 2020-07-19 DIAGNOSIS — R2681 Unsteadiness on feet: Secondary | ICD-10-CM | POA: Diagnosis not present

## 2020-07-19 DIAGNOSIS — N390 Urinary tract infection, site not specified: Secondary | ICD-10-CM | POA: Diagnosis not present

## 2020-07-19 DIAGNOSIS — M6281 Muscle weakness (generalized): Secondary | ICD-10-CM | POA: Diagnosis not present

## 2020-07-21 DIAGNOSIS — R2681 Unsteadiness on feet: Secondary | ICD-10-CM | POA: Diagnosis not present

## 2020-07-21 DIAGNOSIS — M6281 Muscle weakness (generalized): Secondary | ICD-10-CM | POA: Diagnosis not present

## 2020-07-21 DIAGNOSIS — N390 Urinary tract infection, site not specified: Secondary | ICD-10-CM | POA: Diagnosis not present

## 2020-07-25 DIAGNOSIS — N390 Urinary tract infection, site not specified: Secondary | ICD-10-CM | POA: Diagnosis not present

## 2020-07-25 DIAGNOSIS — R2681 Unsteadiness on feet: Secondary | ICD-10-CM | POA: Diagnosis not present

## 2020-07-25 DIAGNOSIS — M6281 Muscle weakness (generalized): Secondary | ICD-10-CM | POA: Diagnosis not present

## 2020-07-28 DIAGNOSIS — M6281 Muscle weakness (generalized): Secondary | ICD-10-CM | POA: Diagnosis not present

## 2020-07-28 DIAGNOSIS — R2681 Unsteadiness on feet: Secondary | ICD-10-CM | POA: Diagnosis not present

## 2020-07-28 DIAGNOSIS — N390 Urinary tract infection, site not specified: Secondary | ICD-10-CM | POA: Diagnosis not present

## 2020-07-29 DIAGNOSIS — N390 Urinary tract infection, site not specified: Secondary | ICD-10-CM | POA: Diagnosis not present

## 2020-07-29 DIAGNOSIS — M6281 Muscle weakness (generalized): Secondary | ICD-10-CM | POA: Diagnosis not present

## 2020-07-29 DIAGNOSIS — R2681 Unsteadiness on feet: Secondary | ICD-10-CM | POA: Diagnosis not present

## 2020-07-31 DIAGNOSIS — M6281 Muscle weakness (generalized): Secondary | ICD-10-CM | POA: Diagnosis not present

## 2020-07-31 DIAGNOSIS — N390 Urinary tract infection, site not specified: Secondary | ICD-10-CM | POA: Diagnosis not present

## 2020-07-31 DIAGNOSIS — R2681 Unsteadiness on feet: Secondary | ICD-10-CM | POA: Diagnosis not present

## 2020-08-02 DIAGNOSIS — N390 Urinary tract infection, site not specified: Secondary | ICD-10-CM | POA: Diagnosis not present

## 2020-08-02 DIAGNOSIS — M6281 Muscle weakness (generalized): Secondary | ICD-10-CM | POA: Diagnosis not present

## 2020-08-02 DIAGNOSIS — R2681 Unsteadiness on feet: Secondary | ICD-10-CM | POA: Diagnosis not present

## 2020-08-04 DIAGNOSIS — N390 Urinary tract infection, site not specified: Secondary | ICD-10-CM | POA: Diagnosis not present

## 2020-08-04 DIAGNOSIS — R2681 Unsteadiness on feet: Secondary | ICD-10-CM | POA: Diagnosis not present

## 2020-08-04 DIAGNOSIS — M6281 Muscle weakness (generalized): Secondary | ICD-10-CM | POA: Diagnosis not present

## 2020-08-08 DIAGNOSIS — M6281 Muscle weakness (generalized): Secondary | ICD-10-CM | POA: Diagnosis not present

## 2020-08-08 DIAGNOSIS — N390 Urinary tract infection, site not specified: Secondary | ICD-10-CM | POA: Diagnosis not present

## 2020-08-08 DIAGNOSIS — R2681 Unsteadiness on feet: Secondary | ICD-10-CM | POA: Diagnosis not present

## 2020-08-09 DIAGNOSIS — R2681 Unsteadiness on feet: Secondary | ICD-10-CM | POA: Diagnosis not present

## 2020-08-09 DIAGNOSIS — M6281 Muscle weakness (generalized): Secondary | ICD-10-CM | POA: Diagnosis not present

## 2020-08-09 DIAGNOSIS — N390 Urinary tract infection, site not specified: Secondary | ICD-10-CM | POA: Diagnosis not present

## 2020-08-10 DIAGNOSIS — N390 Urinary tract infection, site not specified: Secondary | ICD-10-CM | POA: Diagnosis not present

## 2020-08-10 DIAGNOSIS — M6281 Muscle weakness (generalized): Secondary | ICD-10-CM | POA: Diagnosis not present

## 2020-08-10 DIAGNOSIS — R2681 Unsteadiness on feet: Secondary | ICD-10-CM | POA: Diagnosis not present

## 2020-08-29 DIAGNOSIS — N390 Urinary tract infection, site not specified: Secondary | ICD-10-CM | POA: Diagnosis not present

## 2020-10-07 ENCOUNTER — Emergency Department (HOSPITAL_COMMUNITY): Payer: Medicare HMO

## 2020-10-07 ENCOUNTER — Emergency Department (HOSPITAL_COMMUNITY)
Admission: EM | Admit: 2020-10-07 | Discharge: 2020-10-07 | Disposition: A | Discharge status: Home / Self Care | Payer: Medicare HMO | Attending: Emergency Medicine | Admitting: Emergency Medicine

## 2020-10-07 ENCOUNTER — Other Ambulatory Visit: Payer: Self-pay

## 2020-10-07 ENCOUNTER — Encounter (HOSPITAL_COMMUNITY): Payer: Self-pay | Admitting: Emergency Medicine

## 2020-10-07 DIAGNOSIS — W01198A Fall on same level from slipping, tripping and stumbling with subsequent striking against other object, initial encounter: Secondary | ICD-10-CM | POA: Insufficient documentation

## 2020-10-07 DIAGNOSIS — S0081XA Abrasion of other part of head, initial encounter: Secondary | ICD-10-CM | POA: Diagnosis not present

## 2020-10-07 DIAGNOSIS — Z85828 Personal history of other malignant neoplasm of skin: Secondary | ICD-10-CM | POA: Insufficient documentation

## 2020-10-07 DIAGNOSIS — I10 Essential (primary) hypertension: Secondary | ICD-10-CM | POA: Diagnosis not present

## 2020-10-07 DIAGNOSIS — S0990XA Unspecified injury of head, initial encounter: Secondary | ICD-10-CM

## 2020-10-07 DIAGNOSIS — R58 Hemorrhage, not elsewhere classified: Secondary | ICD-10-CM | POA: Diagnosis not present

## 2020-10-07 DIAGNOSIS — W19XXXA Unspecified fall, initial encounter: Secondary | ICD-10-CM | POA: Diagnosis not present

## 2020-10-07 NOTE — ED Triage Notes (Signed)
Patient presents from memory care post fall. During the fall the patient hit her head on the fall. A laceration can be seen above the right eye. Facility states the patient is at baseline.    EMS vitals: 154/74 BP 78 HR 98% O2 sat on room air 106 CBG

## 2020-10-07 NOTE — Discharge Instructions (Addendum)
Return for any problem.  ?

## 2020-10-07 NOTE — ED Provider Notes (Signed)
[name] Provider Note   CSN: [id number] Arrival date & time: 10/07/20  1535     History Chief Complaint  Patient presents with   Fall   Laceration    Kayla Garner is a 75 y.o. female.  75 year old female with prior medical history as detailed below presents for evaluation.  Patient with fall from standing.  She was sent to the ED for evaluation of same.  She has a small abrasion to the right temple.  Patient is at her mental status baseline. She is accompanied by her daughter.  The patient's daughter declines CT imaging or additional work-up.  The history is provided by the patient, a relative and medical records.  Fall This is a recurrent problem. The current episode started 3 to 5 hours ago. The problem occurs every several days. The problem has not changed since onset.Nothing aggravates the symptoms. Nothing relieves the symptoms.  Laceration     Past Medical History:  Diagnosis Date   Hypercholesteremia    Hypertension    Pinched nerve in neck    Squamous cell carcinoma of skin 04/23/2011   Right forehead - CX3 + 5FU    Patient Active Problem List   Diagnosis Date Noted   Syncope 11/24/2016    Past Surgical History:  Procedure Laterality Date   NECK SURGERY     titanium plate and screws in neck       OB History   No obstetric history on file.     History reviewed. No pertinent family history.  Social History   Tobacco Use   Smoking status: Never    Home Medications Prior to Admission medications   Medication Sig Start Date End Date Taking? Authorizing Provider  amLODipine (NORVASC) 5 MG tablet Take 5 mg by mouth daily.    [provider]  Ascorbic Acid (VITAMIN C) 1000 MG tablet Take 1,000 mg by mouth daily.    [provider]  aspirin 81 MG tablet Take 81 mg by mouth daily.    [provider]  cephALEXin (KEFLEX) 500 MG capsule Take 1 capsule (500 mg total) by mouth 4 (four)  times daily. 12/08/19   Palumbo, April, MD  cetirizine (ZYRTEC) 10 MG tablet Take 10 mg by mouth daily.    [provider]  Cholecalciferol (VITAMIN D3) 2000 UNITS TABS Take 1 tablet by mouth daily.    [provider]  Coenzyme Q10 (CO Q-10) 200 MG CAPS Take 1 capsule by mouth daily.    [provider]  cyclobenzaprine (FLEXERIL) 10 MG tablet Take 1 tablet (10 mg total) by mouth 3 (three) times daily as needed for muscle spasms. 03/14/13   Veryl Speak, MD  DHEA 25 MG CAPS Take 1 capsule by mouth.    [provider]  diphenhydrAMINE (BENADRYL) 25 mg capsule Take 25 mg by mouth every 6 (six) hours as needed.    [provider]  Astrid Drafts (MAXIMUM RED KRILL PO) Take 500 mg by mouth daily.    [provider]  loratadine (CLARITIN) 10 MG tablet Take 10 mg by mouth daily.    [provider]  lovastatin (MEVACOR) 20 MG tablet Take 20 mg by mouth at bedtime.    [provider]  metoprolol tartrate (LOPRESSOR) 25 MG tablet Take 25 mg by mouth 2 (two) times daily.    [provider]  Multiple Vitamins-Minerals (CENTRUM SILVER ULTRA WOMENS PO) Take 1 tablet by mouth daily.    [provider]  simethicone (MYLICON) 998 MG chewable tablet Chew 125 mg by mouth every 6 (six) hours as needed for flatulence.    [provider]    Allergies    Patient has no known allergies.  Review of Systems   Review of Systems  Unable to perform ROS: Dementia   Physical Exam Updated Vital Signs BP (!) 144/76 (BP Location: Left Arm)   Pulse 77   Temp 97.8 F (36.6 C) (Oral)   Resp 18   SpO2 97%   Physical Exam Vitals and nursing note reviewed.  Constitutional:      General: She is not in acute distress.    Appearance: Normal appearance. She is well-developed.  HENT:     Head: Normocephalic.     Comments: Small abrasion to right temple  Eyes:     Conjunctiva/sclera: Conjunctivae normal.     Pupils: Pupils are  equal, round, and reactive to light.  Cardiovascular:     Rate and Rhythm: Normal rate and regular rhythm.     Heart sounds: Normal heart sounds.  Pulmonary:     Effort: Pulmonary effort is normal. No respiratory distress.     Breath sounds: Normal breath sounds.  Abdominal:     General: There is no distension.     Palpations: Abdomen is soft.     Tenderness: There is no abdominal tenderness.  Musculoskeletal:        General: No deformity. Normal range of motion.     Cervical back: Normal range of motion and neck supple.  Skin:    General: Skin is warm and dry.  Neurological:     General: No focal deficit present.     Mental Status: She is alert and oriented to person, place, and time.    ED Results / Procedures / Treatments   Labs (all labs ordered are listed, but only abnormal results are displayed) Labs Reviewed - No data to display  EKG None  Radiology No results found.  Procedures Procedures   Medications Ordered in ED Medications - No data to display  ED Course  I have reviewed the triage vital signs and the nursing notes.  Pertinent labs & imaging results that were available during my care of the patient were reviewed by me and considered in my medical decision making (see chart for details).    MDM Rules/Calculators/A&P                          MDM  MSE complete  ERYCA BOLTE was evaluated in Emergency Department on 10/07/2020 for the symptoms described in the history of present illness. She was evaluated in the context of the global COVID-19 pandemic, which necessitated consideration that the patient might be at risk for infection with the SARS-CoV-2 virus that causes COVID-19. Institutional protocols and algorithms that pertain to the evaluation of patients at risk for COVID-19 are in a state of rapid change based on information released by regulatory bodies including the CDC and federal and state organizations. These policies and algorithms were followed  during the patient's care in the ED.  Patient is presenting for evaluation after reported fall with head injury.  Patient with superficial abrasion to the right temple which does not require repair.  Patient is accompanied by her daughter.  The patient's daughter reports that the patient is at baseline.  The patient's daughter declines additional work-up in the ED including CT imaging of the head and neck.  Patient daughter does understand that missed intracranial injury could result in permanent disability or death. Daughter again declines imaging today. The patient is DNR and would not want neurosurgical intervention - even if indicated.   Importance of close follow-up is stressed.  Strict return precautions given and understood. Final Clinical Impression(s) / ED Diagnoses Final diagnoses:  Injury of head, initial encounter    Rx / DC Orders ED Discharge Orders     None        Valarie Merino, MD 10/07/20 1729

## 2020-11-03 DIAGNOSIS — M6281 Muscle weakness (generalized): Secondary | ICD-10-CM | POA: Diagnosis not present

## 2020-11-03 DIAGNOSIS — R2681 Unsteadiness on feet: Secondary | ICD-10-CM | POA: Diagnosis not present

## 2020-11-03 DIAGNOSIS — Z9181 History of falling: Secondary | ICD-10-CM | POA: Diagnosis not present

## 2020-11-06 DIAGNOSIS — R2681 Unsteadiness on feet: Secondary | ICD-10-CM | POA: Diagnosis not present

## 2020-11-06 DIAGNOSIS — M6281 Muscle weakness (generalized): Secondary | ICD-10-CM | POA: Diagnosis not present

## 2020-11-06 DIAGNOSIS — Z9181 History of falling: Secondary | ICD-10-CM | POA: Diagnosis not present

## 2020-11-06 DIAGNOSIS — R278 Other lack of coordination: Secondary | ICD-10-CM | POA: Diagnosis not present

## 2020-11-08 DIAGNOSIS — Z9181 History of falling: Secondary | ICD-10-CM | POA: Diagnosis not present

## 2020-11-08 DIAGNOSIS — R2681 Unsteadiness on feet: Secondary | ICD-10-CM | POA: Diagnosis not present

## 2020-11-08 DIAGNOSIS — M6281 Muscle weakness (generalized): Secondary | ICD-10-CM | POA: Diagnosis not present

## 2020-11-09 DIAGNOSIS — M6281 Muscle weakness (generalized): Secondary | ICD-10-CM | POA: Diagnosis not present

## 2020-11-09 DIAGNOSIS — N390 Urinary tract infection, site not specified: Secondary | ICD-10-CM | POA: Diagnosis not present

## 2020-11-09 DIAGNOSIS — R278 Other lack of coordination: Secondary | ICD-10-CM | POA: Diagnosis not present

## 2020-11-10 DIAGNOSIS — R278 Other lack of coordination: Secondary | ICD-10-CM | POA: Diagnosis not present

## 2020-11-10 DIAGNOSIS — Z9181 History of falling: Secondary | ICD-10-CM | POA: Diagnosis not present

## 2020-11-10 DIAGNOSIS — R2681 Unsteadiness on feet: Secondary | ICD-10-CM | POA: Diagnosis not present

## 2020-11-10 DIAGNOSIS — M6281 Muscle weakness (generalized): Secondary | ICD-10-CM | POA: Diagnosis not present

## 2020-11-13 DIAGNOSIS — M6281 Muscle weakness (generalized): Secondary | ICD-10-CM | POA: Diagnosis not present

## 2020-11-13 DIAGNOSIS — Z9181 History of falling: Secondary | ICD-10-CM | POA: Diagnosis not present

## 2020-11-13 DIAGNOSIS — R2681 Unsteadiness on feet: Secondary | ICD-10-CM | POA: Diagnosis not present

## 2020-11-14 DIAGNOSIS — R278 Other lack of coordination: Secondary | ICD-10-CM | POA: Diagnosis not present

## 2020-11-14 DIAGNOSIS — M6281 Muscle weakness (generalized): Secondary | ICD-10-CM | POA: Diagnosis not present

## 2020-11-15 DIAGNOSIS — M6281 Muscle weakness (generalized): Secondary | ICD-10-CM | POA: Diagnosis not present

## 2020-11-15 DIAGNOSIS — R2681 Unsteadiness on feet: Secondary | ICD-10-CM | POA: Diagnosis not present

## 2020-11-15 DIAGNOSIS — R278 Other lack of coordination: Secondary | ICD-10-CM | POA: Diagnosis not present

## 2020-11-15 DIAGNOSIS — Z9181 History of falling: Secondary | ICD-10-CM | POA: Diagnosis not present

## 2020-11-16 DIAGNOSIS — R278 Other lack of coordination: Secondary | ICD-10-CM | POA: Diagnosis not present

## 2020-11-16 DIAGNOSIS — M6281 Muscle weakness (generalized): Secondary | ICD-10-CM | POA: Diagnosis not present

## 2020-11-17 DIAGNOSIS — Z9181 History of falling: Secondary | ICD-10-CM | POA: Diagnosis not present

## 2020-11-17 DIAGNOSIS — M6281 Muscle weakness (generalized): Secondary | ICD-10-CM | POA: Diagnosis not present

## 2020-11-17 DIAGNOSIS — R2681 Unsteadiness on feet: Secondary | ICD-10-CM | POA: Diagnosis not present

## 2020-11-20 DIAGNOSIS — M6281 Muscle weakness (generalized): Secondary | ICD-10-CM | POA: Diagnosis not present

## 2020-11-20 DIAGNOSIS — R2681 Unsteadiness on feet: Secondary | ICD-10-CM | POA: Diagnosis not present

## 2020-11-20 DIAGNOSIS — Z9181 History of falling: Secondary | ICD-10-CM | POA: Diagnosis not present

## 2020-11-20 DIAGNOSIS — R278 Other lack of coordination: Secondary | ICD-10-CM | POA: Diagnosis not present

## 2020-11-21 DIAGNOSIS — Z9181 History of falling: Secondary | ICD-10-CM | POA: Diagnosis not present

## 2020-11-21 DIAGNOSIS — R2681 Unsteadiness on feet: Secondary | ICD-10-CM | POA: Diagnosis not present

## 2020-11-21 DIAGNOSIS — M6281 Muscle weakness (generalized): Secondary | ICD-10-CM | POA: Diagnosis not present

## 2020-11-22 DIAGNOSIS — W2209XA Striking against other stationary object, initial encounter: Secondary | ICD-10-CM | POA: Diagnosis not present

## 2020-11-22 DIAGNOSIS — W19XXXA Unspecified fall, initial encounter: Secondary | ICD-10-CM | POA: Diagnosis not present

## 2020-11-22 DIAGNOSIS — S0181XA Laceration without foreign body of other part of head, initial encounter: Secondary | ICD-10-CM | POA: Diagnosis not present

## 2020-11-22 DIAGNOSIS — M6281 Muscle weakness (generalized): Secondary | ICD-10-CM | POA: Diagnosis not present

## 2020-11-22 DIAGNOSIS — R278 Other lack of coordination: Secondary | ICD-10-CM | POA: Diagnosis not present

## 2020-11-22 DIAGNOSIS — H1131 Conjunctival hemorrhage, right eye: Secondary | ICD-10-CM | POA: Diagnosis not present

## 2020-11-22 DIAGNOSIS — S0083XA Contusion of other part of head, initial encounter: Secondary | ICD-10-CM | POA: Diagnosis not present

## 2020-11-23 DIAGNOSIS — R278 Other lack of coordination: Secondary | ICD-10-CM | POA: Diagnosis not present

## 2020-11-23 DIAGNOSIS — M6281 Muscle weakness (generalized): Secondary | ICD-10-CM | POA: Diagnosis not present

## 2020-11-24 DIAGNOSIS — W19XXXA Unspecified fall, initial encounter: Secondary | ICD-10-CM | POA: Diagnosis not present

## 2020-11-24 DIAGNOSIS — W2209XD Striking against other stationary object, subsequent encounter: Secondary | ICD-10-CM | POA: Diagnosis not present

## 2020-11-24 DIAGNOSIS — R2681 Unsteadiness on feet: Secondary | ICD-10-CM | POA: Diagnosis not present

## 2020-11-24 DIAGNOSIS — S0181XD Laceration without foreign body of other part of head, subsequent encounter: Secondary | ICD-10-CM | POA: Diagnosis not present

## 2020-11-24 DIAGNOSIS — S60051A Contusion of right little finger without damage to nail, initial encounter: Secondary | ICD-10-CM | POA: Diagnosis not present

## 2020-11-24 DIAGNOSIS — Z9181 History of falling: Secondary | ICD-10-CM | POA: Diagnosis not present

## 2020-11-24 DIAGNOSIS — M6281 Muscle weakness (generalized): Secondary | ICD-10-CM | POA: Diagnosis not present

## 2020-11-27 DIAGNOSIS — R2681 Unsteadiness on feet: Secondary | ICD-10-CM | POA: Diagnosis not present

## 2020-11-27 DIAGNOSIS — Z9181 History of falling: Secondary | ICD-10-CM | POA: Diagnosis not present

## 2020-11-27 DIAGNOSIS — M6281 Muscle weakness (generalized): Secondary | ICD-10-CM | POA: Diagnosis not present

## 2020-11-28 DIAGNOSIS — Z9181 History of falling: Secondary | ICD-10-CM | POA: Diagnosis not present

## 2020-11-28 DIAGNOSIS — R278 Other lack of coordination: Secondary | ICD-10-CM | POA: Diagnosis not present

## 2020-11-28 DIAGNOSIS — R2681 Unsteadiness on feet: Secondary | ICD-10-CM | POA: Diagnosis not present

## 2020-11-28 DIAGNOSIS — M6281 Muscle weakness (generalized): Secondary | ICD-10-CM | POA: Diagnosis not present

## 2020-11-29 DIAGNOSIS — R278 Other lack of coordination: Secondary | ICD-10-CM | POA: Diagnosis not present

## 2020-11-29 DIAGNOSIS — M6281 Muscle weakness (generalized): Secondary | ICD-10-CM | POA: Diagnosis not present

## 2020-12-01 DIAGNOSIS — R278 Other lack of coordination: Secondary | ICD-10-CM | POA: Diagnosis not present

## 2020-12-01 DIAGNOSIS — Z9181 History of falling: Secondary | ICD-10-CM | POA: Diagnosis not present

## 2020-12-01 DIAGNOSIS — M6281 Muscle weakness (generalized): Secondary | ICD-10-CM | POA: Diagnosis not present

## 2020-12-01 DIAGNOSIS — R2681 Unsteadiness on feet: Secondary | ICD-10-CM | POA: Diagnosis not present

## 2020-12-04 DIAGNOSIS — M6281 Muscle weakness (generalized): Secondary | ICD-10-CM | POA: Diagnosis not present

## 2020-12-04 DIAGNOSIS — R2681 Unsteadiness on feet: Secondary | ICD-10-CM | POA: Diagnosis not present

## 2020-12-04 DIAGNOSIS — Z9181 History of falling: Secondary | ICD-10-CM | POA: Diagnosis not present

## 2020-12-05 DIAGNOSIS — M6281 Muscle weakness (generalized): Secondary | ICD-10-CM | POA: Diagnosis not present

## 2020-12-05 DIAGNOSIS — Z9181 History of falling: Secondary | ICD-10-CM | POA: Diagnosis not present

## 2020-12-05 DIAGNOSIS — R2681 Unsteadiness on feet: Secondary | ICD-10-CM | POA: Diagnosis not present

## 2020-12-06 DIAGNOSIS — R278 Other lack of coordination: Secondary | ICD-10-CM | POA: Diagnosis not present

## 2020-12-06 DIAGNOSIS — M6281 Muscle weakness (generalized): Secondary | ICD-10-CM | POA: Diagnosis not present

## 2020-12-07 DIAGNOSIS — R278 Other lack of coordination: Secondary | ICD-10-CM | POA: Diagnosis not present

## 2020-12-07 DIAGNOSIS — M6281 Muscle weakness (generalized): Secondary | ICD-10-CM | POA: Diagnosis not present

## 2020-12-08 DIAGNOSIS — R2681 Unsteadiness on feet: Secondary | ICD-10-CM | POA: Diagnosis not present

## 2020-12-08 DIAGNOSIS — Z9181 History of falling: Secondary | ICD-10-CM | POA: Diagnosis not present

## 2020-12-08 DIAGNOSIS — M6281 Muscle weakness (generalized): Secondary | ICD-10-CM | POA: Diagnosis not present

## 2020-12-11 DIAGNOSIS — M6281 Muscle weakness (generalized): Secondary | ICD-10-CM | POA: Diagnosis not present

## 2020-12-11 DIAGNOSIS — R278 Other lack of coordination: Secondary | ICD-10-CM | POA: Diagnosis not present

## 2020-12-12 DIAGNOSIS — R2681 Unsteadiness on feet: Secondary | ICD-10-CM | POA: Diagnosis not present

## 2020-12-12 DIAGNOSIS — M6281 Muscle weakness (generalized): Secondary | ICD-10-CM | POA: Diagnosis not present

## 2020-12-12 DIAGNOSIS — Z9181 History of falling: Secondary | ICD-10-CM | POA: Diagnosis not present

## 2020-12-13 DIAGNOSIS — R278 Other lack of coordination: Secondary | ICD-10-CM | POA: Diagnosis not present

## 2020-12-13 DIAGNOSIS — M6281 Muscle weakness (generalized): Secondary | ICD-10-CM | POA: Diagnosis not present

## 2020-12-13 DIAGNOSIS — Z9181 History of falling: Secondary | ICD-10-CM | POA: Diagnosis not present

## 2020-12-13 DIAGNOSIS — R2681 Unsteadiness on feet: Secondary | ICD-10-CM | POA: Diagnosis not present

## 2020-12-14 DIAGNOSIS — M6281 Muscle weakness (generalized): Secondary | ICD-10-CM | POA: Diagnosis not present

## 2020-12-14 DIAGNOSIS — Z9181 History of falling: Secondary | ICD-10-CM | POA: Diagnosis not present

## 2020-12-14 DIAGNOSIS — R2681 Unsteadiness on feet: Secondary | ICD-10-CM | POA: Diagnosis not present

## 2020-12-14 DIAGNOSIS — R278 Other lack of coordination: Secondary | ICD-10-CM | POA: Diagnosis not present

## 2020-12-18 DIAGNOSIS — M6281 Muscle weakness (generalized): Secondary | ICD-10-CM | POA: Diagnosis not present

## 2020-12-18 DIAGNOSIS — R2681 Unsteadiness on feet: Secondary | ICD-10-CM | POA: Diagnosis not present

## 2020-12-18 DIAGNOSIS — Z9181 History of falling: Secondary | ICD-10-CM | POA: Diagnosis not present

## 2020-12-18 DIAGNOSIS — R278 Other lack of coordination: Secondary | ICD-10-CM | POA: Diagnosis not present

## 2020-12-19 DIAGNOSIS — M6281 Muscle weakness (generalized): Secondary | ICD-10-CM | POA: Diagnosis not present

## 2020-12-19 DIAGNOSIS — R2681 Unsteadiness on feet: Secondary | ICD-10-CM | POA: Diagnosis not present

## 2020-12-19 DIAGNOSIS — Z9181 History of falling: Secondary | ICD-10-CM | POA: Diagnosis not present

## 2020-12-21 DIAGNOSIS — M6281 Muscle weakness (generalized): Secondary | ICD-10-CM | POA: Diagnosis not present

## 2020-12-21 DIAGNOSIS — R278 Other lack of coordination: Secondary | ICD-10-CM | POA: Diagnosis not present

## 2020-12-22 DIAGNOSIS — R278 Other lack of coordination: Secondary | ICD-10-CM | POA: Diagnosis not present

## 2020-12-22 DIAGNOSIS — Z9181 History of falling: Secondary | ICD-10-CM | POA: Diagnosis not present

## 2020-12-22 DIAGNOSIS — M6281 Muscle weakness (generalized): Secondary | ICD-10-CM | POA: Diagnosis not present

## 2020-12-22 DIAGNOSIS — R2681 Unsteadiness on feet: Secondary | ICD-10-CM | POA: Diagnosis not present

## 2020-12-25 DIAGNOSIS — R2681 Unsteadiness on feet: Secondary | ICD-10-CM | POA: Diagnosis not present

## 2020-12-25 DIAGNOSIS — R278 Other lack of coordination: Secondary | ICD-10-CM | POA: Diagnosis not present

## 2020-12-25 DIAGNOSIS — M6281 Muscle weakness (generalized): Secondary | ICD-10-CM | POA: Diagnosis not present

## 2020-12-25 DIAGNOSIS — Z9181 History of falling: Secondary | ICD-10-CM | POA: Diagnosis not present

## 2020-12-27 DIAGNOSIS — R278 Other lack of coordination: Secondary | ICD-10-CM | POA: Diagnosis not present

## 2020-12-27 DIAGNOSIS — M6281 Muscle weakness (generalized): Secondary | ICD-10-CM | POA: Diagnosis not present

## 2020-12-28 DIAGNOSIS — M6281 Muscle weakness (generalized): Secondary | ICD-10-CM | POA: Diagnosis not present

## 2020-12-28 DIAGNOSIS — R2681 Unsteadiness on feet: Secondary | ICD-10-CM | POA: Diagnosis not present

## 2020-12-28 DIAGNOSIS — Z9181 History of falling: Secondary | ICD-10-CM | POA: Diagnosis not present

## 2020-12-29 DIAGNOSIS — R278 Other lack of coordination: Secondary | ICD-10-CM | POA: Diagnosis not present

## 2020-12-29 DIAGNOSIS — M6281 Muscle weakness (generalized): Secondary | ICD-10-CM | POA: Diagnosis not present

## 2021-01-01 DIAGNOSIS — M6281 Muscle weakness (generalized): Secondary | ICD-10-CM | POA: Diagnosis not present

## 2021-01-01 DIAGNOSIS — R2681 Unsteadiness on feet: Secondary | ICD-10-CM | POA: Diagnosis not present

## 2021-01-01 DIAGNOSIS — R278 Other lack of coordination: Secondary | ICD-10-CM | POA: Diagnosis not present

## 2021-01-01 DIAGNOSIS — Z9181 History of falling: Secondary | ICD-10-CM | POA: Diagnosis not present

## 2021-01-02 DIAGNOSIS — R278 Other lack of coordination: Secondary | ICD-10-CM | POA: Diagnosis not present

## 2021-01-02 DIAGNOSIS — M6281 Muscle weakness (generalized): Secondary | ICD-10-CM | POA: Diagnosis not present

## 2021-01-02 DIAGNOSIS — Z9181 History of falling: Secondary | ICD-10-CM | POA: Diagnosis not present

## 2021-01-02 DIAGNOSIS — R2681 Unsteadiness on feet: Secondary | ICD-10-CM | POA: Diagnosis not present

## 2021-01-03 DIAGNOSIS — R2681 Unsteadiness on feet: Secondary | ICD-10-CM | POA: Diagnosis not present

## 2021-01-03 DIAGNOSIS — Z9181 History of falling: Secondary | ICD-10-CM | POA: Diagnosis not present

## 2021-01-03 DIAGNOSIS — M6281 Muscle weakness (generalized): Secondary | ICD-10-CM | POA: Diagnosis not present

## 2021-01-04 DIAGNOSIS — M6281 Muscle weakness (generalized): Secondary | ICD-10-CM | POA: Diagnosis not present

## 2021-01-04 DIAGNOSIS — R278 Other lack of coordination: Secondary | ICD-10-CM | POA: Diagnosis not present

## 2021-01-08 DIAGNOSIS — M6281 Muscle weakness (generalized): Secondary | ICD-10-CM | POA: Diagnosis not present

## 2021-01-08 DIAGNOSIS — R2681 Unsteadiness on feet: Secondary | ICD-10-CM | POA: Diagnosis not present

## 2021-01-08 DIAGNOSIS — R278 Other lack of coordination: Secondary | ICD-10-CM | POA: Diagnosis not present

## 2021-01-08 DIAGNOSIS — Z9181 History of falling: Secondary | ICD-10-CM | POA: Diagnosis not present

## 2021-01-09 DIAGNOSIS — Z9181 History of falling: Secondary | ICD-10-CM | POA: Diagnosis not present

## 2021-01-09 DIAGNOSIS — R2681 Unsteadiness on feet: Secondary | ICD-10-CM | POA: Diagnosis not present

## 2021-01-09 DIAGNOSIS — M6281 Muscle weakness (generalized): Secondary | ICD-10-CM | POA: Diagnosis not present

## 2021-01-10 DIAGNOSIS — M6281 Muscle weakness (generalized): Secondary | ICD-10-CM | POA: Diagnosis not present

## 2021-01-10 DIAGNOSIS — Z9181 History of falling: Secondary | ICD-10-CM | POA: Diagnosis not present

## 2021-01-10 DIAGNOSIS — R2681 Unsteadiness on feet: Secondary | ICD-10-CM | POA: Diagnosis not present

## 2021-01-10 DIAGNOSIS — R278 Other lack of coordination: Secondary | ICD-10-CM | POA: Diagnosis not present

## 2021-01-12 DIAGNOSIS — M6281 Muscle weakness (generalized): Secondary | ICD-10-CM | POA: Diagnosis not present

## 2021-01-12 DIAGNOSIS — R278 Other lack of coordination: Secondary | ICD-10-CM | POA: Diagnosis not present

## 2021-01-15 DIAGNOSIS — Z9181 History of falling: Secondary | ICD-10-CM | POA: Diagnosis not present

## 2021-01-15 DIAGNOSIS — R2681 Unsteadiness on feet: Secondary | ICD-10-CM | POA: Diagnosis not present

## 2021-01-15 DIAGNOSIS — M6281 Muscle weakness (generalized): Secondary | ICD-10-CM | POA: Diagnosis not present

## 2021-01-15 DIAGNOSIS — R278 Other lack of coordination: Secondary | ICD-10-CM | POA: Diagnosis not present

## 2021-01-16 DIAGNOSIS — M6281 Muscle weakness (generalized): Secondary | ICD-10-CM | POA: Diagnosis not present

## 2021-01-16 DIAGNOSIS — R2681 Unsteadiness on feet: Secondary | ICD-10-CM | POA: Diagnosis not present

## 2021-01-16 DIAGNOSIS — Z9181 History of falling: Secondary | ICD-10-CM | POA: Diagnosis not present

## 2021-01-17 DIAGNOSIS — R2681 Unsteadiness on feet: Secondary | ICD-10-CM | POA: Diagnosis not present

## 2021-01-17 DIAGNOSIS — M6281 Muscle weakness (generalized): Secondary | ICD-10-CM | POA: Diagnosis not present

## 2021-01-17 DIAGNOSIS — Z9181 History of falling: Secondary | ICD-10-CM | POA: Diagnosis not present

## 2021-01-17 DIAGNOSIS — R278 Other lack of coordination: Secondary | ICD-10-CM | POA: Diagnosis not present

## 2021-01-18 DIAGNOSIS — F324 Major depressive disorder, single episode, in partial remission: Secondary | ICD-10-CM | POA: Diagnosis not present

## 2021-01-18 DIAGNOSIS — E7800 Pure hypercholesterolemia, unspecified: Secondary | ICD-10-CM | POA: Diagnosis not present

## 2021-01-18 DIAGNOSIS — F5101 Primary insomnia: Secondary | ICD-10-CM | POA: Diagnosis not present

## 2021-01-18 DIAGNOSIS — I10 Essential (primary) hypertension: Secondary | ICD-10-CM | POA: Diagnosis not present

## 2021-01-18 DIAGNOSIS — G301 Alzheimer's disease with late onset: Secondary | ICD-10-CM | POA: Diagnosis not present

## 2021-01-19 DIAGNOSIS — R278 Other lack of coordination: Secondary | ICD-10-CM | POA: Diagnosis not present

## 2021-01-19 DIAGNOSIS — M6281 Muscle weakness (generalized): Secondary | ICD-10-CM | POA: Diagnosis not present

## 2021-01-22 DIAGNOSIS — R2681 Unsteadiness on feet: Secondary | ICD-10-CM | POA: Diagnosis not present

## 2021-01-22 DIAGNOSIS — M6281 Muscle weakness (generalized): Secondary | ICD-10-CM | POA: Diagnosis not present

## 2021-01-22 DIAGNOSIS — Z9181 History of falling: Secondary | ICD-10-CM | POA: Diagnosis not present

## 2021-01-22 DIAGNOSIS — R278 Other lack of coordination: Secondary | ICD-10-CM | POA: Diagnosis not present

## 2021-01-23 DIAGNOSIS — R2681 Unsteadiness on feet: Secondary | ICD-10-CM | POA: Diagnosis not present

## 2021-01-23 DIAGNOSIS — M6281 Muscle weakness (generalized): Secondary | ICD-10-CM | POA: Diagnosis not present

## 2021-01-23 DIAGNOSIS — Z9181 History of falling: Secondary | ICD-10-CM | POA: Diagnosis not present

## 2021-01-24 DIAGNOSIS — M6281 Muscle weakness (generalized): Secondary | ICD-10-CM | POA: Diagnosis not present

## 2021-01-24 DIAGNOSIS — R2681 Unsteadiness on feet: Secondary | ICD-10-CM | POA: Diagnosis not present

## 2021-01-24 DIAGNOSIS — Z9181 History of falling: Secondary | ICD-10-CM | POA: Diagnosis not present

## 2021-01-26 DIAGNOSIS — R278 Other lack of coordination: Secondary | ICD-10-CM | POA: Diagnosis not present

## 2021-01-26 DIAGNOSIS — M6281 Muscle weakness (generalized): Secondary | ICD-10-CM | POA: Diagnosis not present

## 2021-01-29 DIAGNOSIS — M6281 Muscle weakness (generalized): Secondary | ICD-10-CM | POA: Diagnosis not present

## 2021-01-29 DIAGNOSIS — Z9181 History of falling: Secondary | ICD-10-CM | POA: Diagnosis not present

## 2021-01-29 DIAGNOSIS — R2681 Unsteadiness on feet: Secondary | ICD-10-CM | POA: Diagnosis not present

## 2021-01-30 DIAGNOSIS — R278 Other lack of coordination: Secondary | ICD-10-CM | POA: Diagnosis not present

## 2021-01-30 DIAGNOSIS — R2681 Unsteadiness on feet: Secondary | ICD-10-CM | POA: Diagnosis not present

## 2021-01-30 DIAGNOSIS — Z9181 History of falling: Secondary | ICD-10-CM | POA: Diagnosis not present

## 2021-01-30 DIAGNOSIS — M6281 Muscle weakness (generalized): Secondary | ICD-10-CM | POA: Diagnosis not present

## 2021-01-31 DIAGNOSIS — R2681 Unsteadiness on feet: Secondary | ICD-10-CM | POA: Diagnosis not present

## 2021-01-31 DIAGNOSIS — M6281 Muscle weakness (generalized): Secondary | ICD-10-CM | POA: Diagnosis not present

## 2021-01-31 DIAGNOSIS — Z9181 History of falling: Secondary | ICD-10-CM | POA: Diagnosis not present

## 2021-02-01 DIAGNOSIS — R278 Other lack of coordination: Secondary | ICD-10-CM | POA: Diagnosis not present

## 2021-02-01 DIAGNOSIS — M6281 Muscle weakness (generalized): Secondary | ICD-10-CM | POA: Diagnosis not present

## 2021-02-02 DIAGNOSIS — R278 Other lack of coordination: Secondary | ICD-10-CM | POA: Diagnosis not present

## 2021-02-02 DIAGNOSIS — M6281 Muscle weakness (generalized): Secondary | ICD-10-CM | POA: Diagnosis not present

## 2021-02-05 DIAGNOSIS — M6281 Muscle weakness (generalized): Secondary | ICD-10-CM | POA: Diagnosis not present

## 2021-02-05 DIAGNOSIS — Z9181 History of falling: Secondary | ICD-10-CM | POA: Diagnosis not present

## 2021-02-05 DIAGNOSIS — R2681 Unsteadiness on feet: Secondary | ICD-10-CM | POA: Diagnosis not present

## 2021-02-22 DIAGNOSIS — Z9181 History of falling: Secondary | ICD-10-CM | POA: Diagnosis not present

## 2021-02-22 DIAGNOSIS — M6281 Muscle weakness (generalized): Secondary | ICD-10-CM | POA: Diagnosis not present

## 2021-02-22 DIAGNOSIS — R2681 Unsteadiness on feet: Secondary | ICD-10-CM | POA: Diagnosis not present

## 2021-02-28 DIAGNOSIS — M6281 Muscle weakness (generalized): Secondary | ICD-10-CM | POA: Diagnosis not present

## 2021-02-28 DIAGNOSIS — R2681 Unsteadiness on feet: Secondary | ICD-10-CM | POA: Diagnosis not present

## 2021-02-28 DIAGNOSIS — Z9181 History of falling: Secondary | ICD-10-CM | POA: Diagnosis not present

## 2021-03-10 DIAGNOSIS — M6281 Muscle weakness (generalized): Secondary | ICD-10-CM | POA: Diagnosis not present

## 2021-03-10 DIAGNOSIS — R2681 Unsteadiness on feet: Secondary | ICD-10-CM | POA: Diagnosis not present

## 2021-03-10 DIAGNOSIS — Z9181 History of falling: Secondary | ICD-10-CM | POA: Diagnosis not present

## 2021-03-15 DIAGNOSIS — M6281 Muscle weakness (generalized): Secondary | ICD-10-CM | POA: Diagnosis not present

## 2021-03-15 DIAGNOSIS — Z9181 History of falling: Secondary | ICD-10-CM | POA: Diagnosis not present

## 2021-03-15 DIAGNOSIS — R2681 Unsteadiness on feet: Secondary | ICD-10-CM | POA: Diagnosis not present

## 2021-03-17 DIAGNOSIS — Z9181 History of falling: Secondary | ICD-10-CM | POA: Diagnosis not present

## 2021-03-17 DIAGNOSIS — M6281 Muscle weakness (generalized): Secondary | ICD-10-CM | POA: Diagnosis not present

## 2021-03-17 DIAGNOSIS — R2681 Unsteadiness on feet: Secondary | ICD-10-CM | POA: Diagnosis not present

## 2021-03-21 DIAGNOSIS — Z9181 History of falling: Secondary | ICD-10-CM | POA: Diagnosis not present

## 2021-03-21 DIAGNOSIS — M6281 Muscle weakness (generalized): Secondary | ICD-10-CM | POA: Diagnosis not present

## 2021-03-21 DIAGNOSIS — R2681 Unsteadiness on feet: Secondary | ICD-10-CM | POA: Diagnosis not present

## 2021-03-26 DIAGNOSIS — N390 Urinary tract infection, site not specified: Secondary | ICD-10-CM | POA: Diagnosis not present

## 2021-05-03 DIAGNOSIS — Z9181 History of falling: Secondary | ICD-10-CM | POA: Diagnosis not present

## 2021-05-03 DIAGNOSIS — R2681 Unsteadiness on feet: Secondary | ICD-10-CM | POA: Diagnosis not present

## 2021-05-16 DIAGNOSIS — Z9181 History of falling: Secondary | ICD-10-CM | POA: Diagnosis not present

## 2021-05-16 DIAGNOSIS — R2681 Unsteadiness on feet: Secondary | ICD-10-CM | POA: Diagnosis not present

## 2021-05-17 DIAGNOSIS — Z9181 History of falling: Secondary | ICD-10-CM | POA: Diagnosis not present

## 2021-05-17 DIAGNOSIS — R2681 Unsteadiness on feet: Secondary | ICD-10-CM | POA: Diagnosis not present

## 2021-05-23 DIAGNOSIS — Z9181 History of falling: Secondary | ICD-10-CM | POA: Diagnosis not present

## 2021-05-23 DIAGNOSIS — R2681 Unsteadiness on feet: Secondary | ICD-10-CM | POA: Diagnosis not present

## 2021-05-24 DIAGNOSIS — R2681 Unsteadiness on feet: Secondary | ICD-10-CM | POA: Diagnosis not present

## 2021-05-24 DIAGNOSIS — Z9181 History of falling: Secondary | ICD-10-CM | POA: Diagnosis not present

## 2021-05-26 DIAGNOSIS — R8290 Unspecified abnormal findings in urine: Secondary | ICD-10-CM | POA: Diagnosis not present

## 2021-05-28 DIAGNOSIS — R2681 Unsteadiness on feet: Secondary | ICD-10-CM | POA: Diagnosis not present

## 2021-05-28 DIAGNOSIS — Z9181 History of falling: Secondary | ICD-10-CM | POA: Diagnosis not present

## 2021-05-31 DIAGNOSIS — Z9181 History of falling: Secondary | ICD-10-CM | POA: Diagnosis not present

## 2021-05-31 DIAGNOSIS — R2681 Unsteadiness on feet: Secondary | ICD-10-CM | POA: Diagnosis not present

## 2021-06-05 DIAGNOSIS — Z9181 History of falling: Secondary | ICD-10-CM | POA: Diagnosis not present

## 2021-06-05 DIAGNOSIS — R2681 Unsteadiness on feet: Secondary | ICD-10-CM | POA: Diagnosis not present

## 2021-06-07 DIAGNOSIS — R2681 Unsteadiness on feet: Secondary | ICD-10-CM | POA: Diagnosis not present

## 2021-06-07 DIAGNOSIS — Z9181 History of falling: Secondary | ICD-10-CM | POA: Diagnosis not present

## 2021-06-11 DIAGNOSIS — R2681 Unsteadiness on feet: Secondary | ICD-10-CM | POA: Diagnosis not present

## 2021-06-11 DIAGNOSIS — Z9181 History of falling: Secondary | ICD-10-CM | POA: Diagnosis not present

## 2021-06-20 DIAGNOSIS — Z9181 History of falling: Secondary | ICD-10-CM | POA: Diagnosis not present

## 2021-06-20 DIAGNOSIS — R2681 Unsteadiness on feet: Secondary | ICD-10-CM | POA: Diagnosis not present

## 2021-06-21 DIAGNOSIS — R2681 Unsteadiness on feet: Secondary | ICD-10-CM | POA: Diagnosis not present

## 2021-06-21 DIAGNOSIS — Z9181 History of falling: Secondary | ICD-10-CM | POA: Diagnosis not present

## 2021-06-24 IMAGING — MG DIGITAL SCREENING BILATERAL MAMMOGRAM WITH TOMO AND CAD
6 of 10 series · 6 of 30 positions shown · non-contrast
Comparison: Previous exam(s).

CLINICAL DATA: Screening.

EXAM:
DIGITAL SCREENING BILATERAL MAMMOGRAM WITH TOMO AND CAD

[R MLO synth-2D]
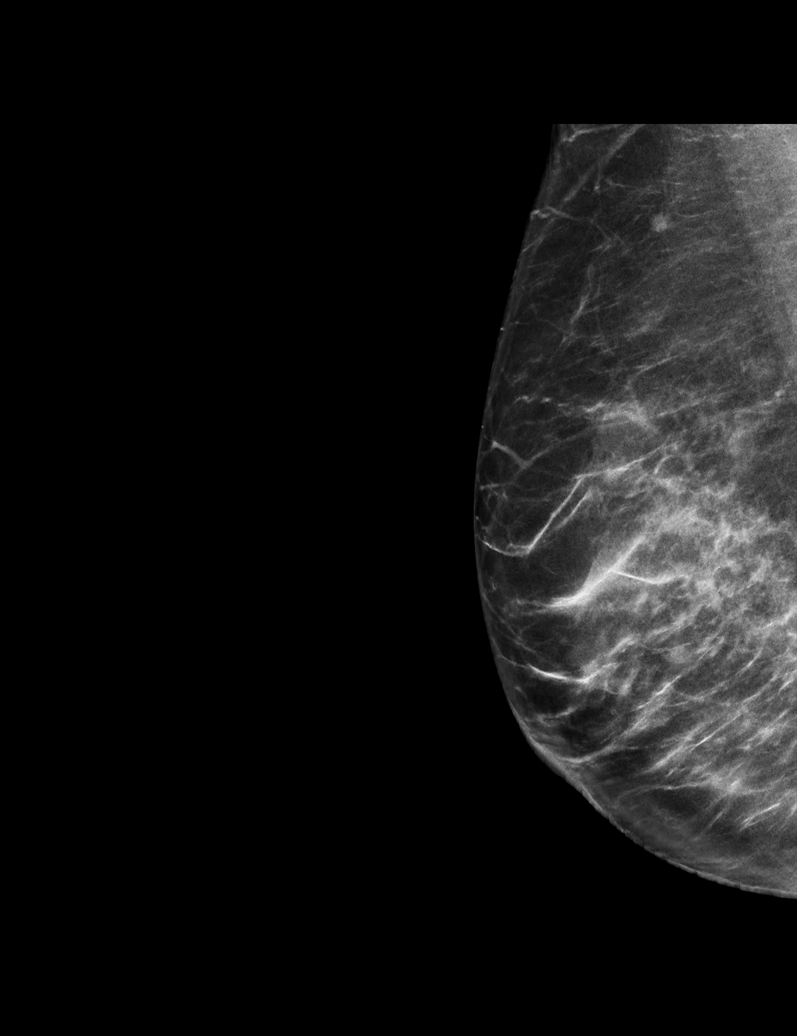

[L CC synth-2D]
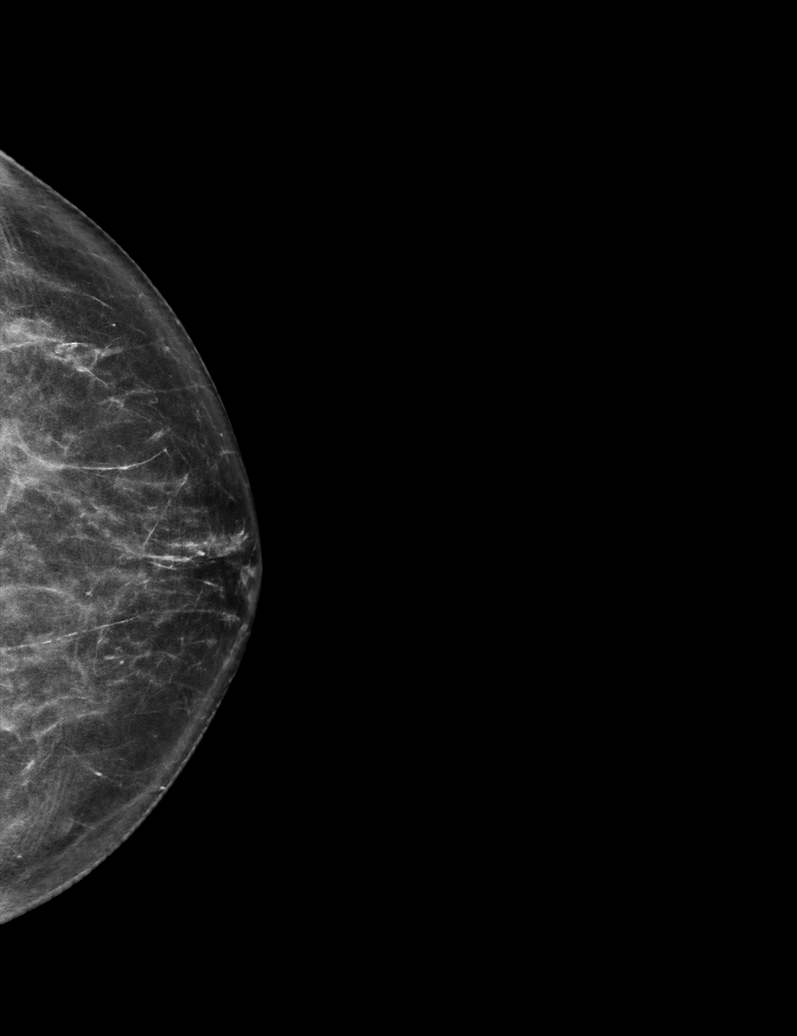

[L MLO synth-2D (1 of 2)]
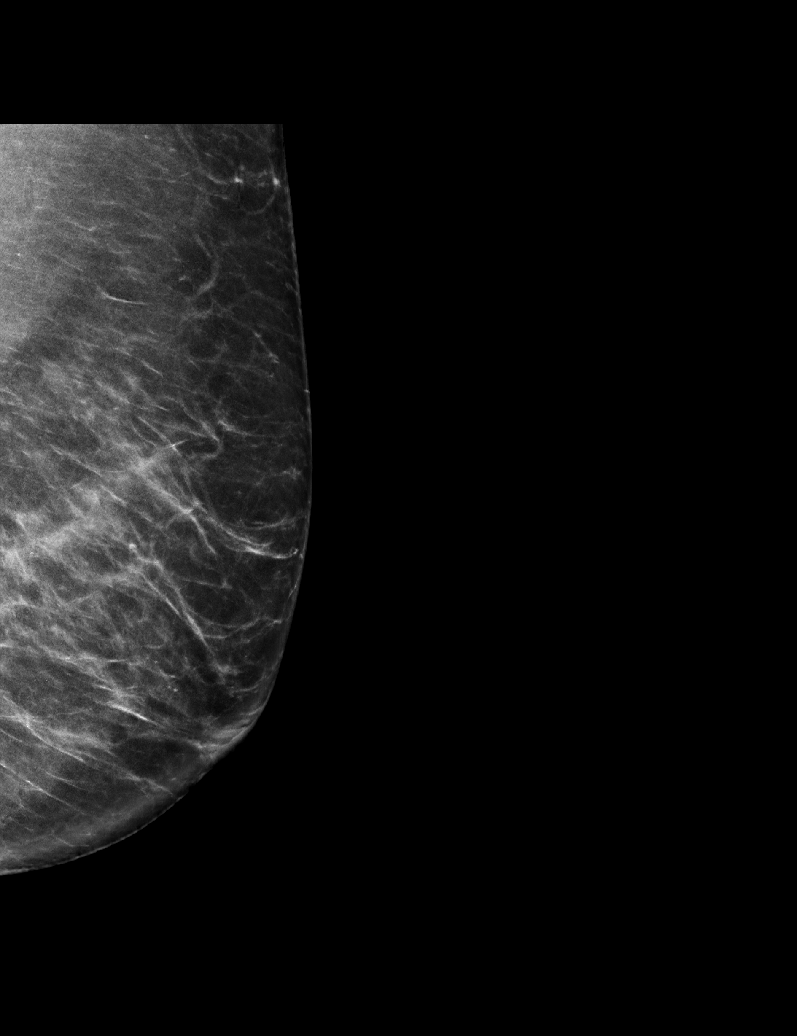

[R CC synth-2D]
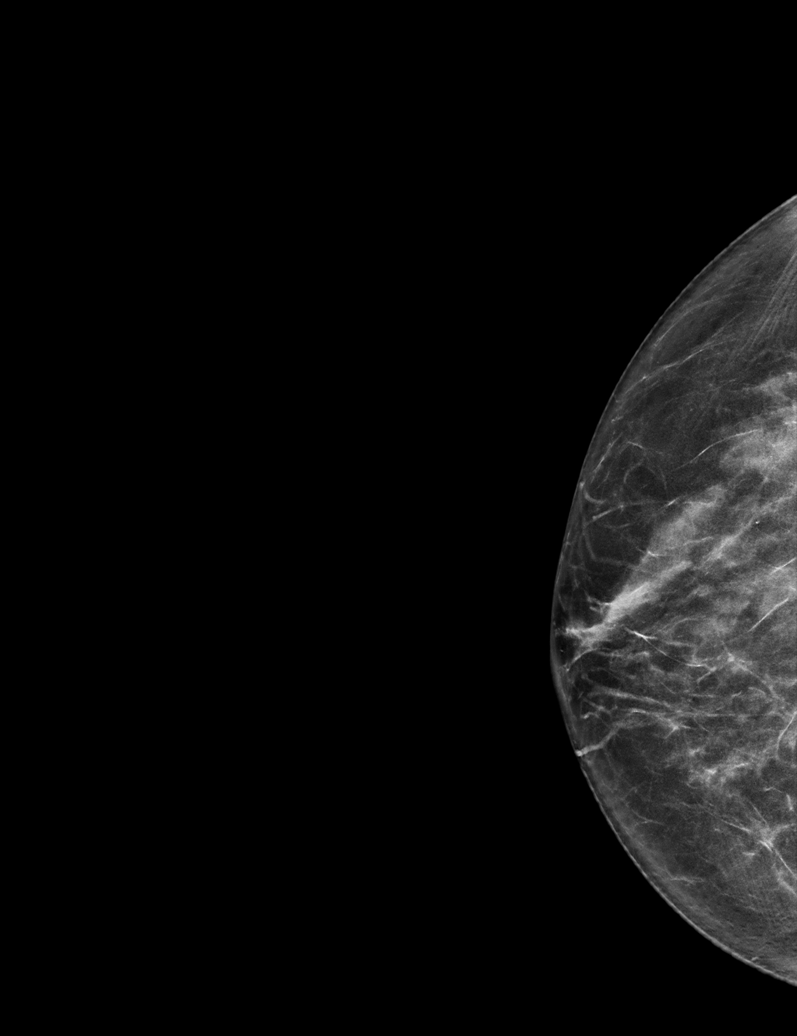

[L MLO synth-2D (2 of 2)]
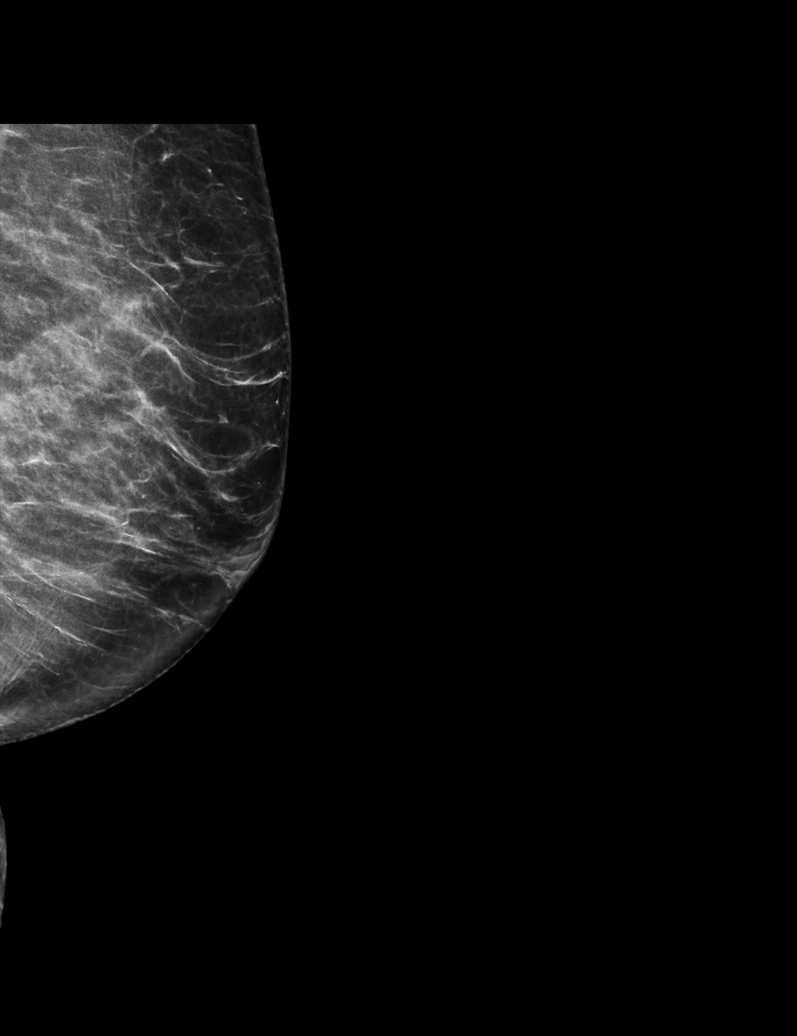

[R MLO tomo · tomo slice 35/68.0]
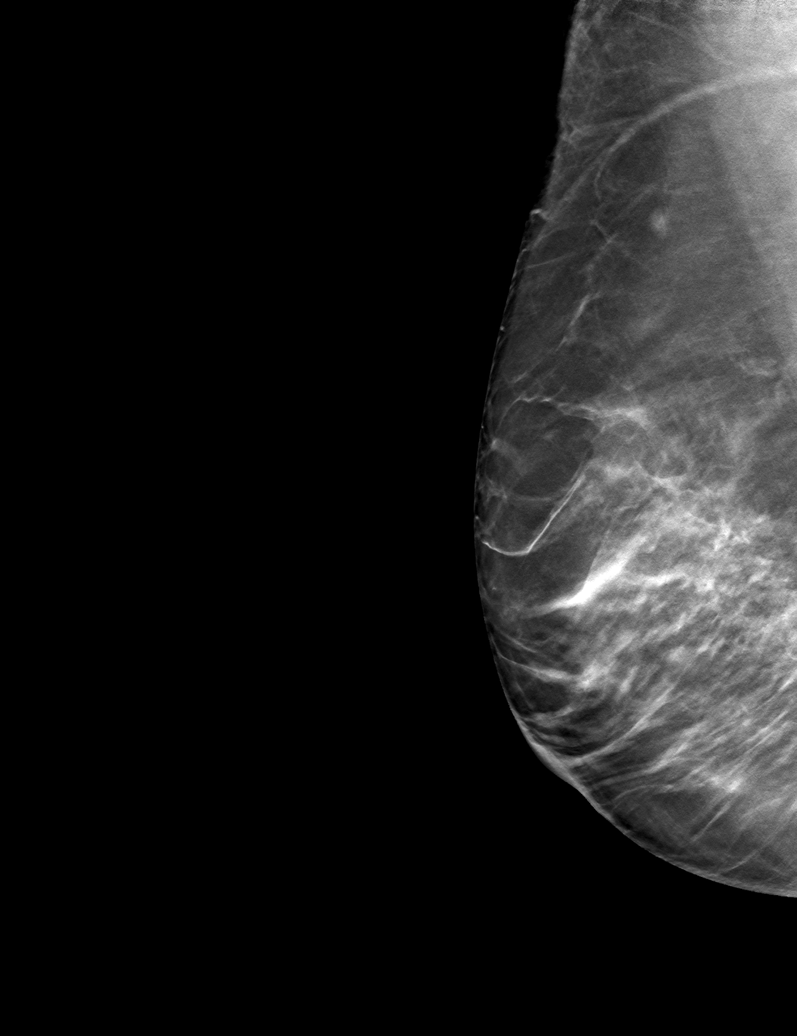

[6 of 30 positions shown; findings below may reference images not displayed]

ACR Breast Density Category b: There are scattered areas of
fibroglandular density.
FINDINGS: There are no findings suspicious for malignancy. Images were
processed with CAD.
IMPRESSION: No mammographic evidence of malignancy. A result letter of this
screening mammogram will be mailed directly to the patient.

RECOMMENDATION:
Screening mammogram in one year. (Code:CN-U-775)

BI-RADS CATEGORY  1: Negative.

## 2021-06-28 DIAGNOSIS — R2681 Unsteadiness on feet: Secondary | ICD-10-CM | POA: Diagnosis not present

## 2021-06-28 DIAGNOSIS — Z9181 History of falling: Secondary | ICD-10-CM | POA: Diagnosis not present

## 2021-07-02 DIAGNOSIS — R2681 Unsteadiness on feet: Secondary | ICD-10-CM | POA: Diagnosis not present

## 2021-07-02 DIAGNOSIS — Z9181 History of falling: Secondary | ICD-10-CM | POA: Diagnosis not present

## 2021-07-06 DIAGNOSIS — R2681 Unsteadiness on feet: Secondary | ICD-10-CM | POA: Diagnosis not present

## 2021-07-06 DIAGNOSIS — Z9181 History of falling: Secondary | ICD-10-CM | POA: Diagnosis not present

## 2021-07-12 DIAGNOSIS — R2681 Unsteadiness on feet: Secondary | ICD-10-CM | POA: Diagnosis not present

## 2021-07-12 DIAGNOSIS — Z9181 History of falling: Secondary | ICD-10-CM | POA: Diagnosis not present

## 2021-07-19 DIAGNOSIS — R2681 Unsteadiness on feet: Secondary | ICD-10-CM | POA: Diagnosis not present

## 2021-07-19 DIAGNOSIS — Z9181 History of falling: Secondary | ICD-10-CM | POA: Diagnosis not present

## 2021-07-23 DIAGNOSIS — F5101 Primary insomnia: Secondary | ICD-10-CM | POA: Diagnosis not present

## 2021-07-23 DIAGNOSIS — F324 Major depressive disorder, single episode, in partial remission: Secondary | ICD-10-CM | POA: Diagnosis not present

## 2021-07-23 DIAGNOSIS — E7800 Pure hypercholesterolemia, unspecified: Secondary | ICD-10-CM | POA: Diagnosis not present

## 2021-07-23 DIAGNOSIS — I10 Essential (primary) hypertension: Secondary | ICD-10-CM | POA: Diagnosis not present

## 2021-07-23 DIAGNOSIS — G301 Alzheimer's disease with late onset: Secondary | ICD-10-CM | POA: Diagnosis not present

## 2021-07-24 DIAGNOSIS — Z9181 History of falling: Secondary | ICD-10-CM | POA: Diagnosis not present

## 2021-07-24 DIAGNOSIS — R2681 Unsteadiness on feet: Secondary | ICD-10-CM | POA: Diagnosis not present

## 2021-08-09 DIAGNOSIS — N890 Mild vaginal dysplasia: Secondary | ICD-10-CM | POA: Diagnosis not present

## 2021-09-03 DIAGNOSIS — M6281 Muscle weakness (generalized): Secondary | ICD-10-CM | POA: Diagnosis not present

## 2021-09-03 DIAGNOSIS — R2681 Unsteadiness on feet: Secondary | ICD-10-CM | POA: Diagnosis not present

## 2021-09-05 DIAGNOSIS — M6281 Muscle weakness (generalized): Secondary | ICD-10-CM | POA: Diagnosis not present

## 2021-09-05 DIAGNOSIS — R2681 Unsteadiness on feet: Secondary | ICD-10-CM | POA: Diagnosis not present

## 2021-09-07 DIAGNOSIS — R8290 Unspecified abnormal findings in urine: Secondary | ICD-10-CM | POA: Diagnosis not present

## 2021-09-10 DIAGNOSIS — R2681 Unsteadiness on feet: Secondary | ICD-10-CM | POA: Diagnosis not present

## 2021-09-10 DIAGNOSIS — M6281 Muscle weakness (generalized): Secondary | ICD-10-CM | POA: Diagnosis not present

## 2021-09-14 DIAGNOSIS — R2681 Unsteadiness on feet: Secondary | ICD-10-CM | POA: Diagnosis not present

## 2021-09-14 DIAGNOSIS — M6281 Muscle weakness (generalized): Secondary | ICD-10-CM | POA: Diagnosis not present

## 2021-09-17 DIAGNOSIS — M6281 Muscle weakness (generalized): Secondary | ICD-10-CM | POA: Diagnosis not present

## 2021-09-17 DIAGNOSIS — R2681 Unsteadiness on feet: Secondary | ICD-10-CM | POA: Diagnosis not present

## 2021-09-21 DIAGNOSIS — M6281 Muscle weakness (generalized): Secondary | ICD-10-CM | POA: Diagnosis not present

## 2021-09-21 DIAGNOSIS — R2681 Unsteadiness on feet: Secondary | ICD-10-CM | POA: Diagnosis not present

## 2021-09-24 DIAGNOSIS — M6281 Muscle weakness (generalized): Secondary | ICD-10-CM | POA: Diagnosis not present

## 2021-09-24 DIAGNOSIS — R2681 Unsteadiness on feet: Secondary | ICD-10-CM | POA: Diagnosis not present

## 2021-09-26 DIAGNOSIS — R2681 Unsteadiness on feet: Secondary | ICD-10-CM | POA: Diagnosis not present

## 2021-09-26 DIAGNOSIS — M6281 Muscle weakness (generalized): Secondary | ICD-10-CM | POA: Diagnosis not present

## 2021-10-03 DIAGNOSIS — M6281 Muscle weakness (generalized): Secondary | ICD-10-CM | POA: Diagnosis not present

## 2021-10-03 DIAGNOSIS — R2681 Unsteadiness on feet: Secondary | ICD-10-CM | POA: Diagnosis not present

## 2021-10-05 DIAGNOSIS — M6281 Muscle weakness (generalized): Secondary | ICD-10-CM | POA: Diagnosis not present

## 2021-10-05 DIAGNOSIS — R2681 Unsteadiness on feet: Secondary | ICD-10-CM | POA: Diagnosis not present

## 2021-10-08 DIAGNOSIS — R2681 Unsteadiness on feet: Secondary | ICD-10-CM | POA: Diagnosis not present

## 2021-10-08 DIAGNOSIS — M6281 Muscle weakness (generalized): Secondary | ICD-10-CM | POA: Diagnosis not present

## 2021-10-15 DIAGNOSIS — R2681 Unsteadiness on feet: Secondary | ICD-10-CM | POA: Diagnosis not present

## 2021-10-15 DIAGNOSIS — M6281 Muscle weakness (generalized): Secondary | ICD-10-CM | POA: Diagnosis not present

## 2021-10-17 DIAGNOSIS — R2681 Unsteadiness on feet: Secondary | ICD-10-CM | POA: Diagnosis not present

## 2021-10-17 DIAGNOSIS — M6281 Muscle weakness (generalized): Secondary | ICD-10-CM | POA: Diagnosis not present

## 2021-10-22 DIAGNOSIS — R2681 Unsteadiness on feet: Secondary | ICD-10-CM | POA: Diagnosis not present

## 2021-10-22 DIAGNOSIS — M6281 Muscle weakness (generalized): Secondary | ICD-10-CM | POA: Diagnosis not present

## 2021-10-24 DIAGNOSIS — R2681 Unsteadiness on feet: Secondary | ICD-10-CM | POA: Diagnosis not present

## 2021-10-24 DIAGNOSIS — M6281 Muscle weakness (generalized): Secondary | ICD-10-CM | POA: Diagnosis not present

## 2021-10-29 DIAGNOSIS — R2681 Unsteadiness on feet: Secondary | ICD-10-CM | POA: Diagnosis not present

## 2021-10-29 DIAGNOSIS — M6281 Muscle weakness (generalized): Secondary | ICD-10-CM | POA: Diagnosis not present

## 2022-01-05 DIAGNOSIS — R051 Acute cough: Secondary | ICD-10-CM | POA: Diagnosis not present

## 2022-01-05 DIAGNOSIS — H6122 Impacted cerumen, left ear: Secondary | ICD-10-CM | POA: Diagnosis not present

## 2022-01-05 DIAGNOSIS — R5383 Other fatigue: Secondary | ICD-10-CM | POA: Diagnosis not present

## 2022-01-05 DIAGNOSIS — U071 COVID-19: Secondary | ICD-10-CM | POA: Diagnosis not present

## 2022-01-05 DIAGNOSIS — B349 Viral infection, unspecified: Secondary | ICD-10-CM | POA: Diagnosis not present

## 2022-01-22 DIAGNOSIS — I10 Essential (primary) hypertension: Secondary | ICD-10-CM | POA: Diagnosis not present

## 2022-01-22 DIAGNOSIS — G301 Alzheimer's disease with late onset: Secondary | ICD-10-CM | POA: Diagnosis not present

## 2022-01-22 DIAGNOSIS — E7800 Pure hypercholesterolemia, unspecified: Secondary | ICD-10-CM | POA: Diagnosis not present

## 2022-01-22 DIAGNOSIS — F324 Major depressive disorder, single episode, in partial remission: Secondary | ICD-10-CM | POA: Diagnosis not present

## 2022-01-22 DIAGNOSIS — F5101 Primary insomnia: Secondary | ICD-10-CM | POA: Diagnosis not present

## 2022-01-22 DIAGNOSIS — B349 Viral infection, unspecified: Secondary | ICD-10-CM | POA: Diagnosis not present

## 2022-02-12 DIAGNOSIS — Z0100 Encounter for examination of eyes and vision without abnormal findings: Secondary | ICD-10-CM | POA: Diagnosis not present

## 2022-02-26 DIAGNOSIS — R799 Abnormal finding of blood chemistry, unspecified: Secondary | ICD-10-CM | POA: Diagnosis not present

## 2022-03-05 DIAGNOSIS — E559 Vitamin D deficiency, unspecified: Secondary | ICD-10-CM | POA: Diagnosis not present

## 2022-03-05 DIAGNOSIS — Z7189 Other specified counseling: Secondary | ICD-10-CM | POA: Diagnosis not present

## 2022-03-05 DIAGNOSIS — Z1331 Encounter for screening for depression: Secondary | ICD-10-CM | POA: Diagnosis not present

## 2022-03-05 DIAGNOSIS — I10 Essential (primary) hypertension: Secondary | ICD-10-CM | POA: Diagnosis not present

## 2022-03-05 DIAGNOSIS — G309 Alzheimer's disease, unspecified: Secondary | ICD-10-CM | POA: Diagnosis not present

## 2022-03-05 DIAGNOSIS — E7800 Pure hypercholesterolemia, unspecified: Secondary | ICD-10-CM | POA: Diagnosis not present

## 2022-03-05 DIAGNOSIS — F0283 Dementia in other diseases classified elsewhere, unspecified severity, with mood disturbance: Secondary | ICD-10-CM | POA: Diagnosis not present

## 2022-03-08 DIAGNOSIS — G309 Alzheimer's disease, unspecified: Secondary | ICD-10-CM | POA: Diagnosis not present

## 2022-03-08 DIAGNOSIS — M79642 Pain in left hand: Secondary | ICD-10-CM | POA: Diagnosis not present

## 2022-03-08 DIAGNOSIS — Z9181 History of falling: Secondary | ICD-10-CM | POA: Diagnosis not present

## 2022-03-08 DIAGNOSIS — F0283 Dementia in other diseases classified elsewhere, unspecified severity, with mood disturbance: Secondary | ICD-10-CM | POA: Diagnosis not present

## 2022-03-08 DIAGNOSIS — S61502A Unspecified open wound of left wrist, initial encounter: Secondary | ICD-10-CM | POA: Diagnosis not present

## 2022-03-08 DIAGNOSIS — Z66 Do not resuscitate: Secondary | ICD-10-CM | POA: Diagnosis not present

## 2022-03-08 DIAGNOSIS — M79632 Pain in left forearm: Secondary | ICD-10-CM | POA: Diagnosis not present

## 2022-03-08 DIAGNOSIS — I10 Essential (primary) hypertension: Secondary | ICD-10-CM | POA: Diagnosis not present

## 2022-03-08 DIAGNOSIS — Z7189 Other specified counseling: Secondary | ICD-10-CM | POA: Diagnosis not present

## 2022-03-08 DIAGNOSIS — N390 Urinary tract infection, site not specified: Secondary | ICD-10-CM | POA: Diagnosis not present

## 2022-03-15 DIAGNOSIS — R2681 Unsteadiness on feet: Secondary | ICD-10-CM | POA: Diagnosis not present

## 2022-03-15 DIAGNOSIS — G301 Alzheimer's disease with late onset: Secondary | ICD-10-CM | POA: Diagnosis not present

## 2022-03-15 DIAGNOSIS — M6281 Muscle weakness (generalized): Secondary | ICD-10-CM | POA: Diagnosis not present

## 2022-03-18 DIAGNOSIS — M6281 Muscle weakness (generalized): Secondary | ICD-10-CM | POA: Diagnosis not present

## 2022-03-18 DIAGNOSIS — R2681 Unsteadiness on feet: Secondary | ICD-10-CM | POA: Diagnosis not present

## 2022-03-18 DIAGNOSIS — G301 Alzheimer's disease with late onset: Secondary | ICD-10-CM | POA: Diagnosis not present

## 2022-03-19 DIAGNOSIS — G301 Alzheimer's disease with late onset: Secondary | ICD-10-CM | POA: Diagnosis not present

## 2022-03-19 DIAGNOSIS — M6281 Muscle weakness (generalized): Secondary | ICD-10-CM | POA: Diagnosis not present

## 2022-03-19 DIAGNOSIS — R2681 Unsteadiness on feet: Secondary | ICD-10-CM | POA: Diagnosis not present

## 2022-03-20 DIAGNOSIS — M6281 Muscle weakness (generalized): Secondary | ICD-10-CM | POA: Diagnosis not present

## 2022-03-20 DIAGNOSIS — G301 Alzheimer's disease with late onset: Secondary | ICD-10-CM | POA: Diagnosis not present

## 2022-03-20 DIAGNOSIS — R2681 Unsteadiness on feet: Secondary | ICD-10-CM | POA: Diagnosis not present

## 2022-03-21 DIAGNOSIS — M6281 Muscle weakness (generalized): Secondary | ICD-10-CM | POA: Diagnosis not present

## 2022-03-21 DIAGNOSIS — G301 Alzheimer's disease with late onset: Secondary | ICD-10-CM | POA: Diagnosis not present

## 2022-03-21 DIAGNOSIS — R2681 Unsteadiness on feet: Secondary | ICD-10-CM | POA: Diagnosis not present

## 2022-03-22 DIAGNOSIS — M6281 Muscle weakness (generalized): Secondary | ICD-10-CM | POA: Diagnosis not present

## 2022-03-22 DIAGNOSIS — G301 Alzheimer's disease with late onset: Secondary | ICD-10-CM | POA: Diagnosis not present

## 2022-03-22 DIAGNOSIS — R2681 Unsteadiness on feet: Secondary | ICD-10-CM | POA: Diagnosis not present

## 2022-03-23 DIAGNOSIS — M6281 Muscle weakness (generalized): Secondary | ICD-10-CM | POA: Diagnosis not present

## 2022-03-23 DIAGNOSIS — R2681 Unsteadiness on feet: Secondary | ICD-10-CM | POA: Diagnosis not present

## 2022-03-23 DIAGNOSIS — G301 Alzheimer's disease with late onset: Secondary | ICD-10-CM | POA: Diagnosis not present

## 2022-03-25 DIAGNOSIS — G301 Alzheimer's disease with late onset: Secondary | ICD-10-CM | POA: Diagnosis not present

## 2022-03-25 DIAGNOSIS — R2681 Unsteadiness on feet: Secondary | ICD-10-CM | POA: Diagnosis not present

## 2022-03-25 DIAGNOSIS — M6281 Muscle weakness (generalized): Secondary | ICD-10-CM | POA: Diagnosis not present

## 2022-03-26 DIAGNOSIS — M6281 Muscle weakness (generalized): Secondary | ICD-10-CM | POA: Diagnosis not present

## 2022-03-26 DIAGNOSIS — G301 Alzheimer's disease with late onset: Secondary | ICD-10-CM | POA: Diagnosis not present

## 2022-03-26 DIAGNOSIS — R2681 Unsteadiness on feet: Secondary | ICD-10-CM | POA: Diagnosis not present

## 2022-03-27 DIAGNOSIS — I10 Essential (primary) hypertension: Secondary | ICD-10-CM | POA: Diagnosis not present

## 2022-03-27 DIAGNOSIS — E559 Vitamin D deficiency, unspecified: Secondary | ICD-10-CM | POA: Diagnosis not present

## 2022-03-27 DIAGNOSIS — F0280 Dementia in other diseases classified elsewhere without behavioral disturbance: Secondary | ICD-10-CM | POA: Diagnosis not present

## 2022-03-27 DIAGNOSIS — G309 Alzheimer's disease, unspecified: Secondary | ICD-10-CM | POA: Diagnosis not present

## 2022-03-27 DIAGNOSIS — E7800 Pure hypercholesterolemia, unspecified: Secondary | ICD-10-CM | POA: Diagnosis not present

## 2022-03-28 DIAGNOSIS — R2681 Unsteadiness on feet: Secondary | ICD-10-CM | POA: Diagnosis not present

## 2022-03-28 DIAGNOSIS — M6281 Muscle weakness (generalized): Secondary | ICD-10-CM | POA: Diagnosis not present

## 2022-03-28 DIAGNOSIS — G301 Alzheimer's disease with late onset: Secondary | ICD-10-CM | POA: Diagnosis not present

## 2022-03-29 DIAGNOSIS — G301 Alzheimer's disease with late onset: Secondary | ICD-10-CM | POA: Diagnosis not present

## 2022-03-29 DIAGNOSIS — R2681 Unsteadiness on feet: Secondary | ICD-10-CM | POA: Diagnosis not present

## 2022-03-29 DIAGNOSIS — M6281 Muscle weakness (generalized): Secondary | ICD-10-CM | POA: Diagnosis not present

## 2022-04-03 DIAGNOSIS — M6281 Muscle weakness (generalized): Secondary | ICD-10-CM | POA: Diagnosis not present

## 2022-04-03 DIAGNOSIS — G301 Alzheimer's disease with late onset: Secondary | ICD-10-CM | POA: Diagnosis not present

## 2022-04-03 DIAGNOSIS — R2681 Unsteadiness on feet: Secondary | ICD-10-CM | POA: Diagnosis not present

## 2022-04-04 DIAGNOSIS — G301 Alzheimer's disease with late onset: Secondary | ICD-10-CM | POA: Diagnosis not present

## 2022-04-04 DIAGNOSIS — R2681 Unsteadiness on feet: Secondary | ICD-10-CM | POA: Diagnosis not present

## 2022-04-04 DIAGNOSIS — M6281 Muscle weakness (generalized): Secondary | ICD-10-CM | POA: Diagnosis not present

## 2022-04-08 DIAGNOSIS — G301 Alzheimer's disease with late onset: Secondary | ICD-10-CM | POA: Diagnosis not present

## 2022-04-08 DIAGNOSIS — M6281 Muscle weakness (generalized): Secondary | ICD-10-CM | POA: Diagnosis not present

## 2022-04-08 DIAGNOSIS — R2681 Unsteadiness on feet: Secondary | ICD-10-CM | POA: Diagnosis not present

## 2022-04-09 DIAGNOSIS — G301 Alzheimer's disease with late onset: Secondary | ICD-10-CM | POA: Diagnosis not present

## 2022-04-09 DIAGNOSIS — F0283 Dementia in other diseases classified elsewhere, unspecified severity, with mood disturbance: Secondary | ICD-10-CM | POA: Diagnosis not present

## 2022-04-09 DIAGNOSIS — Z1331 Encounter for screening for depression: Secondary | ICD-10-CM | POA: Diagnosis not present

## 2022-04-09 DIAGNOSIS — W19XXXA Unspecified fall, initial encounter: Secondary | ICD-10-CM | POA: Diagnosis not present

## 2022-04-09 DIAGNOSIS — M6281 Muscle weakness (generalized): Secondary | ICD-10-CM | POA: Diagnosis not present

## 2022-04-09 DIAGNOSIS — R2681 Unsteadiness on feet: Secondary | ICD-10-CM | POA: Diagnosis not present

## 2022-04-09 DIAGNOSIS — I10 Essential (primary) hypertension: Secondary | ICD-10-CM | POA: Diagnosis not present

## 2022-04-09 DIAGNOSIS — G309 Alzheimer's disease, unspecified: Secondary | ICD-10-CM | POA: Diagnosis not present

## 2022-04-09 DIAGNOSIS — S0083XD Contusion of other part of head, subsequent encounter: Secondary | ICD-10-CM | POA: Diagnosis not present

## 2022-04-10 DIAGNOSIS — M6281 Muscle weakness (generalized): Secondary | ICD-10-CM | POA: Diagnosis not present

## 2022-04-10 DIAGNOSIS — G301 Alzheimer's disease with late onset: Secondary | ICD-10-CM | POA: Diagnosis not present

## 2022-04-10 DIAGNOSIS — R2681 Unsteadiness on feet: Secondary | ICD-10-CM | POA: Diagnosis not present

## 2022-04-11 DIAGNOSIS — G301 Alzheimer's disease with late onset: Secondary | ICD-10-CM | POA: Diagnosis not present

## 2022-04-11 DIAGNOSIS — M6281 Muscle weakness (generalized): Secondary | ICD-10-CM | POA: Diagnosis not present

## 2022-04-11 DIAGNOSIS — R2681 Unsteadiness on feet: Secondary | ICD-10-CM | POA: Diagnosis not present

## 2022-04-12 DIAGNOSIS — G301 Alzheimer's disease with late onset: Secondary | ICD-10-CM | POA: Diagnosis not present

## 2022-04-12 DIAGNOSIS — R2681 Unsteadiness on feet: Secondary | ICD-10-CM | POA: Diagnosis not present

## 2022-04-12 DIAGNOSIS — M6281 Muscle weakness (generalized): Secondary | ICD-10-CM | POA: Diagnosis not present

## 2022-04-15 DIAGNOSIS — G301 Alzheimer's disease with late onset: Secondary | ICD-10-CM | POA: Diagnosis not present

## 2022-04-15 DIAGNOSIS — M6281 Muscle weakness (generalized): Secondary | ICD-10-CM | POA: Diagnosis not present

## 2022-04-15 DIAGNOSIS — R2681 Unsteadiness on feet: Secondary | ICD-10-CM | POA: Diagnosis not present

## 2022-04-16 DIAGNOSIS — G301 Alzheimer's disease with late onset: Secondary | ICD-10-CM | POA: Diagnosis not present

## 2022-04-16 DIAGNOSIS — M6281 Muscle weakness (generalized): Secondary | ICD-10-CM | POA: Diagnosis not present

## 2022-04-16 DIAGNOSIS — R2681 Unsteadiness on feet: Secondary | ICD-10-CM | POA: Diagnosis not present

## 2022-04-17 DIAGNOSIS — R2681 Unsteadiness on feet: Secondary | ICD-10-CM | POA: Diagnosis not present

## 2022-04-17 DIAGNOSIS — M6281 Muscle weakness (generalized): Secondary | ICD-10-CM | POA: Diagnosis not present

## 2022-04-17 DIAGNOSIS — G301 Alzheimer's disease with late onset: Secondary | ICD-10-CM | POA: Diagnosis not present

## 2022-04-19 DIAGNOSIS — G301 Alzheimer's disease with late onset: Secondary | ICD-10-CM | POA: Diagnosis not present

## 2022-04-19 DIAGNOSIS — R2681 Unsteadiness on feet: Secondary | ICD-10-CM | POA: Diagnosis not present

## 2022-04-19 DIAGNOSIS — M6281 Muscle weakness (generalized): Secondary | ICD-10-CM | POA: Diagnosis not present

## 2022-04-22 DIAGNOSIS — M6281 Muscle weakness (generalized): Secondary | ICD-10-CM | POA: Diagnosis not present

## 2022-04-22 DIAGNOSIS — R2681 Unsteadiness on feet: Secondary | ICD-10-CM | POA: Diagnosis not present

## 2022-04-22 DIAGNOSIS — G301 Alzheimer's disease with late onset: Secondary | ICD-10-CM | POA: Diagnosis not present

## 2022-04-23 DIAGNOSIS — M6281 Muscle weakness (generalized): Secondary | ICD-10-CM | POA: Diagnosis not present

## 2022-04-23 DIAGNOSIS — R2681 Unsteadiness on feet: Secondary | ICD-10-CM | POA: Diagnosis not present

## 2022-04-23 DIAGNOSIS — G301 Alzheimer's disease with late onset: Secondary | ICD-10-CM | POA: Diagnosis not present

## 2022-04-24 DIAGNOSIS — M6281 Muscle weakness (generalized): Secondary | ICD-10-CM | POA: Diagnosis not present

## 2022-04-24 DIAGNOSIS — R2681 Unsteadiness on feet: Secondary | ICD-10-CM | POA: Diagnosis not present

## 2022-04-24 DIAGNOSIS — G301 Alzheimer's disease with late onset: Secondary | ICD-10-CM | POA: Diagnosis not present

## 2022-04-25 DIAGNOSIS — G301 Alzheimer's disease with late onset: Secondary | ICD-10-CM | POA: Diagnosis not present

## 2022-04-25 DIAGNOSIS — M6281 Muscle weakness (generalized): Secondary | ICD-10-CM | POA: Diagnosis not present

## 2022-04-25 DIAGNOSIS — R2681 Unsteadiness on feet: Secondary | ICD-10-CM | POA: Diagnosis not present

## 2022-04-26 DIAGNOSIS — R2681 Unsteadiness on feet: Secondary | ICD-10-CM | POA: Diagnosis not present

## 2022-04-26 DIAGNOSIS — G301 Alzheimer's disease with late onset: Secondary | ICD-10-CM | POA: Diagnosis not present

## 2022-04-26 DIAGNOSIS — M6281 Muscle weakness (generalized): Secondary | ICD-10-CM | POA: Diagnosis not present

## 2022-04-29 DIAGNOSIS — G301 Alzheimer's disease with late onset: Secondary | ICD-10-CM | POA: Diagnosis not present

## 2022-04-29 DIAGNOSIS — R2681 Unsteadiness on feet: Secondary | ICD-10-CM | POA: Diagnosis not present

## 2022-04-29 DIAGNOSIS — M6281 Muscle weakness (generalized): Secondary | ICD-10-CM | POA: Diagnosis not present

## 2022-04-30 DIAGNOSIS — M6281 Muscle weakness (generalized): Secondary | ICD-10-CM | POA: Diagnosis not present

## 2022-04-30 DIAGNOSIS — R2681 Unsteadiness on feet: Secondary | ICD-10-CM | POA: Diagnosis not present

## 2022-04-30 DIAGNOSIS — G301 Alzheimer's disease with late onset: Secondary | ICD-10-CM | POA: Diagnosis not present

## 2022-05-01 DIAGNOSIS — M6281 Muscle weakness (generalized): Secondary | ICD-10-CM | POA: Diagnosis not present

## 2022-05-01 DIAGNOSIS — G301 Alzheimer's disease with late onset: Secondary | ICD-10-CM | POA: Diagnosis not present

## 2022-05-01 DIAGNOSIS — R2681 Unsteadiness on feet: Secondary | ICD-10-CM | POA: Diagnosis not present

## 2022-05-02 DIAGNOSIS — R2681 Unsteadiness on feet: Secondary | ICD-10-CM | POA: Diagnosis not present

## 2022-05-02 DIAGNOSIS — G301 Alzheimer's disease with late onset: Secondary | ICD-10-CM | POA: Diagnosis not present

## 2022-05-02 DIAGNOSIS — M6281 Muscle weakness (generalized): Secondary | ICD-10-CM | POA: Diagnosis not present

## 2022-05-03 DIAGNOSIS — G301 Alzheimer's disease with late onset: Secondary | ICD-10-CM | POA: Diagnosis not present

## 2022-05-03 DIAGNOSIS — R2681 Unsteadiness on feet: Secondary | ICD-10-CM | POA: Diagnosis not present

## 2022-05-03 DIAGNOSIS — M6281 Muscle weakness (generalized): Secondary | ICD-10-CM | POA: Diagnosis not present

## 2022-05-10 DIAGNOSIS — R2681 Unsteadiness on feet: Secondary | ICD-10-CM | POA: Diagnosis not present

## 2022-05-10 DIAGNOSIS — M6281 Muscle weakness (generalized): Secondary | ICD-10-CM | POA: Diagnosis not present

## 2022-05-10 DIAGNOSIS — G301 Alzheimer's disease with late onset: Secondary | ICD-10-CM | POA: Diagnosis not present

## 2022-05-12 DIAGNOSIS — R059 Cough, unspecified: Secondary | ICD-10-CM | POA: Diagnosis not present

## 2022-05-12 DIAGNOSIS — R531 Weakness: Secondary | ICD-10-CM | POA: Diagnosis not present

## 2022-05-12 DIAGNOSIS — R0602 Shortness of breath: Secondary | ICD-10-CM | POA: Diagnosis not present

## 2022-05-13 DIAGNOSIS — R2681 Unsteadiness on feet: Secondary | ICD-10-CM | POA: Diagnosis not present

## 2022-05-13 DIAGNOSIS — M6281 Muscle weakness (generalized): Secondary | ICD-10-CM | POA: Diagnosis not present

## 2022-05-13 DIAGNOSIS — G301 Alzheimer's disease with late onset: Secondary | ICD-10-CM | POA: Diagnosis not present

## 2022-05-14 DIAGNOSIS — G301 Alzheimer's disease with late onset: Secondary | ICD-10-CM | POA: Diagnosis not present

## 2022-05-14 DIAGNOSIS — R2681 Unsteadiness on feet: Secondary | ICD-10-CM | POA: Diagnosis not present

## 2022-05-14 DIAGNOSIS — M6281 Muscle weakness (generalized): Secondary | ICD-10-CM | POA: Diagnosis not present

## 2022-05-15 DIAGNOSIS — R2681 Unsteadiness on feet: Secondary | ICD-10-CM | POA: Diagnosis not present

## 2022-05-15 DIAGNOSIS — G301 Alzheimer's disease with late onset: Secondary | ICD-10-CM | POA: Diagnosis not present

## 2022-05-15 DIAGNOSIS — M6281 Muscle weakness (generalized): Secondary | ICD-10-CM | POA: Diagnosis not present

## 2022-05-16 DIAGNOSIS — G301 Alzheimer's disease with late onset: Secondary | ICD-10-CM | POA: Diagnosis not present

## 2022-05-16 DIAGNOSIS — M6281 Muscle weakness (generalized): Secondary | ICD-10-CM | POA: Diagnosis not present

## 2022-05-16 DIAGNOSIS — R2681 Unsteadiness on feet: Secondary | ICD-10-CM | POA: Diagnosis not present

## 2022-05-17 DIAGNOSIS — R2681 Unsteadiness on feet: Secondary | ICD-10-CM | POA: Diagnosis not present

## 2022-05-17 DIAGNOSIS — G301 Alzheimer's disease with late onset: Secondary | ICD-10-CM | POA: Diagnosis not present

## 2022-05-17 DIAGNOSIS — M6281 Muscle weakness (generalized): Secondary | ICD-10-CM | POA: Diagnosis not present

## 2022-05-20 DIAGNOSIS — R2681 Unsteadiness on feet: Secondary | ICD-10-CM | POA: Diagnosis not present

## 2022-05-20 DIAGNOSIS — G301 Alzheimer's disease with late onset: Secondary | ICD-10-CM | POA: Diagnosis not present

## 2022-05-20 DIAGNOSIS — M6281 Muscle weakness (generalized): Secondary | ICD-10-CM | POA: Diagnosis not present

## 2022-05-21 DIAGNOSIS — G301 Alzheimer's disease with late onset: Secondary | ICD-10-CM | POA: Diagnosis not present

## 2022-05-21 DIAGNOSIS — R2681 Unsteadiness on feet: Secondary | ICD-10-CM | POA: Diagnosis not present

## 2022-05-21 DIAGNOSIS — M6281 Muscle weakness (generalized): Secondary | ICD-10-CM | POA: Diagnosis not present

## 2022-05-22 DIAGNOSIS — R2681 Unsteadiness on feet: Secondary | ICD-10-CM | POA: Diagnosis not present

## 2022-05-22 DIAGNOSIS — M6281 Muscle weakness (generalized): Secondary | ICD-10-CM | POA: Diagnosis not present

## 2022-05-22 DIAGNOSIS — G301 Alzheimer's disease with late onset: Secondary | ICD-10-CM | POA: Diagnosis not present

## 2022-05-23 DIAGNOSIS — M6281 Muscle weakness (generalized): Secondary | ICD-10-CM | POA: Diagnosis not present

## 2022-05-23 DIAGNOSIS — R2681 Unsteadiness on feet: Secondary | ICD-10-CM | POA: Diagnosis not present

## 2022-05-23 DIAGNOSIS — G301 Alzheimer's disease with late onset: Secondary | ICD-10-CM | POA: Diagnosis not present

## 2022-05-24 DIAGNOSIS — G301 Alzheimer's disease with late onset: Secondary | ICD-10-CM | POA: Diagnosis not present

## 2022-05-24 DIAGNOSIS — R2681 Unsteadiness on feet: Secondary | ICD-10-CM | POA: Diagnosis not present

## 2022-05-24 DIAGNOSIS — R799 Abnormal finding of blood chemistry, unspecified: Secondary | ICD-10-CM | POA: Diagnosis not present

## 2022-05-24 DIAGNOSIS — M6281 Muscle weakness (generalized): Secondary | ICD-10-CM | POA: Diagnosis not present

## 2022-05-27 DIAGNOSIS — F32A Depression, unspecified: Secondary | ICD-10-CM | POA: Diagnosis not present

## 2022-05-27 DIAGNOSIS — Z681 Body mass index (BMI) 19 or less, adult: Secondary | ICD-10-CM | POA: Diagnosis not present

## 2022-05-27 DIAGNOSIS — E870 Hyperosmolality and hypernatremia: Secondary | ICD-10-CM | POA: Diagnosis not present

## 2022-05-27 DIAGNOSIS — G309 Alzheimer's disease, unspecified: Secondary | ICD-10-CM | POA: Diagnosis not present

## 2022-05-27 DIAGNOSIS — R634 Abnormal weight loss: Secondary | ICD-10-CM | POA: Diagnosis not present

## 2022-05-27 DIAGNOSIS — F0283 Dementia in other diseases classified elsewhere, unspecified severity, with mood disturbance: Secondary | ICD-10-CM | POA: Diagnosis not present

## 2022-06-06 DIAGNOSIS — F0283 Dementia in other diseases classified elsewhere, unspecified severity, with mood disturbance: Secondary | ICD-10-CM | POA: Diagnosis not present

## 2022-06-06 DIAGNOSIS — I10 Essential (primary) hypertension: Secondary | ICD-10-CM | POA: Diagnosis not present

## 2022-06-06 DIAGNOSIS — E7800 Pure hypercholesterolemia, unspecified: Secondary | ICD-10-CM | POA: Diagnosis not present

## 2022-06-06 DIAGNOSIS — E559 Vitamin D deficiency, unspecified: Secondary | ICD-10-CM | POA: Diagnosis not present

## 2022-06-06 DIAGNOSIS — F32A Depression, unspecified: Secondary | ICD-10-CM | POA: Diagnosis not present

## 2022-06-06 DIAGNOSIS — G309 Alzheimer's disease, unspecified: Secondary | ICD-10-CM | POA: Diagnosis not present

## 2022-06-06 DIAGNOSIS — E870 Hyperosmolality and hypernatremia: Secondary | ICD-10-CM | POA: Diagnosis not present

## 2022-07-26 DIAGNOSIS — B351 Tinea unguium: Secondary | ICD-10-CM | POA: Diagnosis not present

## 2022-07-26 DIAGNOSIS — E46 Unspecified protein-calorie malnutrition: Secondary | ICD-10-CM | POA: Diagnosis not present

## 2022-07-26 DIAGNOSIS — F5101 Primary insomnia: Secondary | ICD-10-CM | POA: Diagnosis not present

## 2022-07-26 DIAGNOSIS — G301 Alzheimer's disease with late onset: Secondary | ICD-10-CM | POA: Diagnosis not present

## 2022-07-26 DIAGNOSIS — F324 Major depressive disorder, single episode, in partial remission: Secondary | ICD-10-CM | POA: Diagnosis not present

## 2022-07-26 DIAGNOSIS — I10 Essential (primary) hypertension: Secondary | ICD-10-CM | POA: Diagnosis not present

## 2022-07-26 DIAGNOSIS — F0280 Dementia in other diseases classified elsewhere without behavioral disturbance: Secondary | ICD-10-CM | POA: Diagnosis not present

## 2022-07-27 IMAGING — DX DG CHEST 2V
2 series · 2 of 2 positions shown · non-contrast
Comparison: 10/24/2003

CLINICAL DATA: Generalized fatigue for 6 hours, hypertension

EXAM:
CHEST - 2 VIEW

[chest lat]
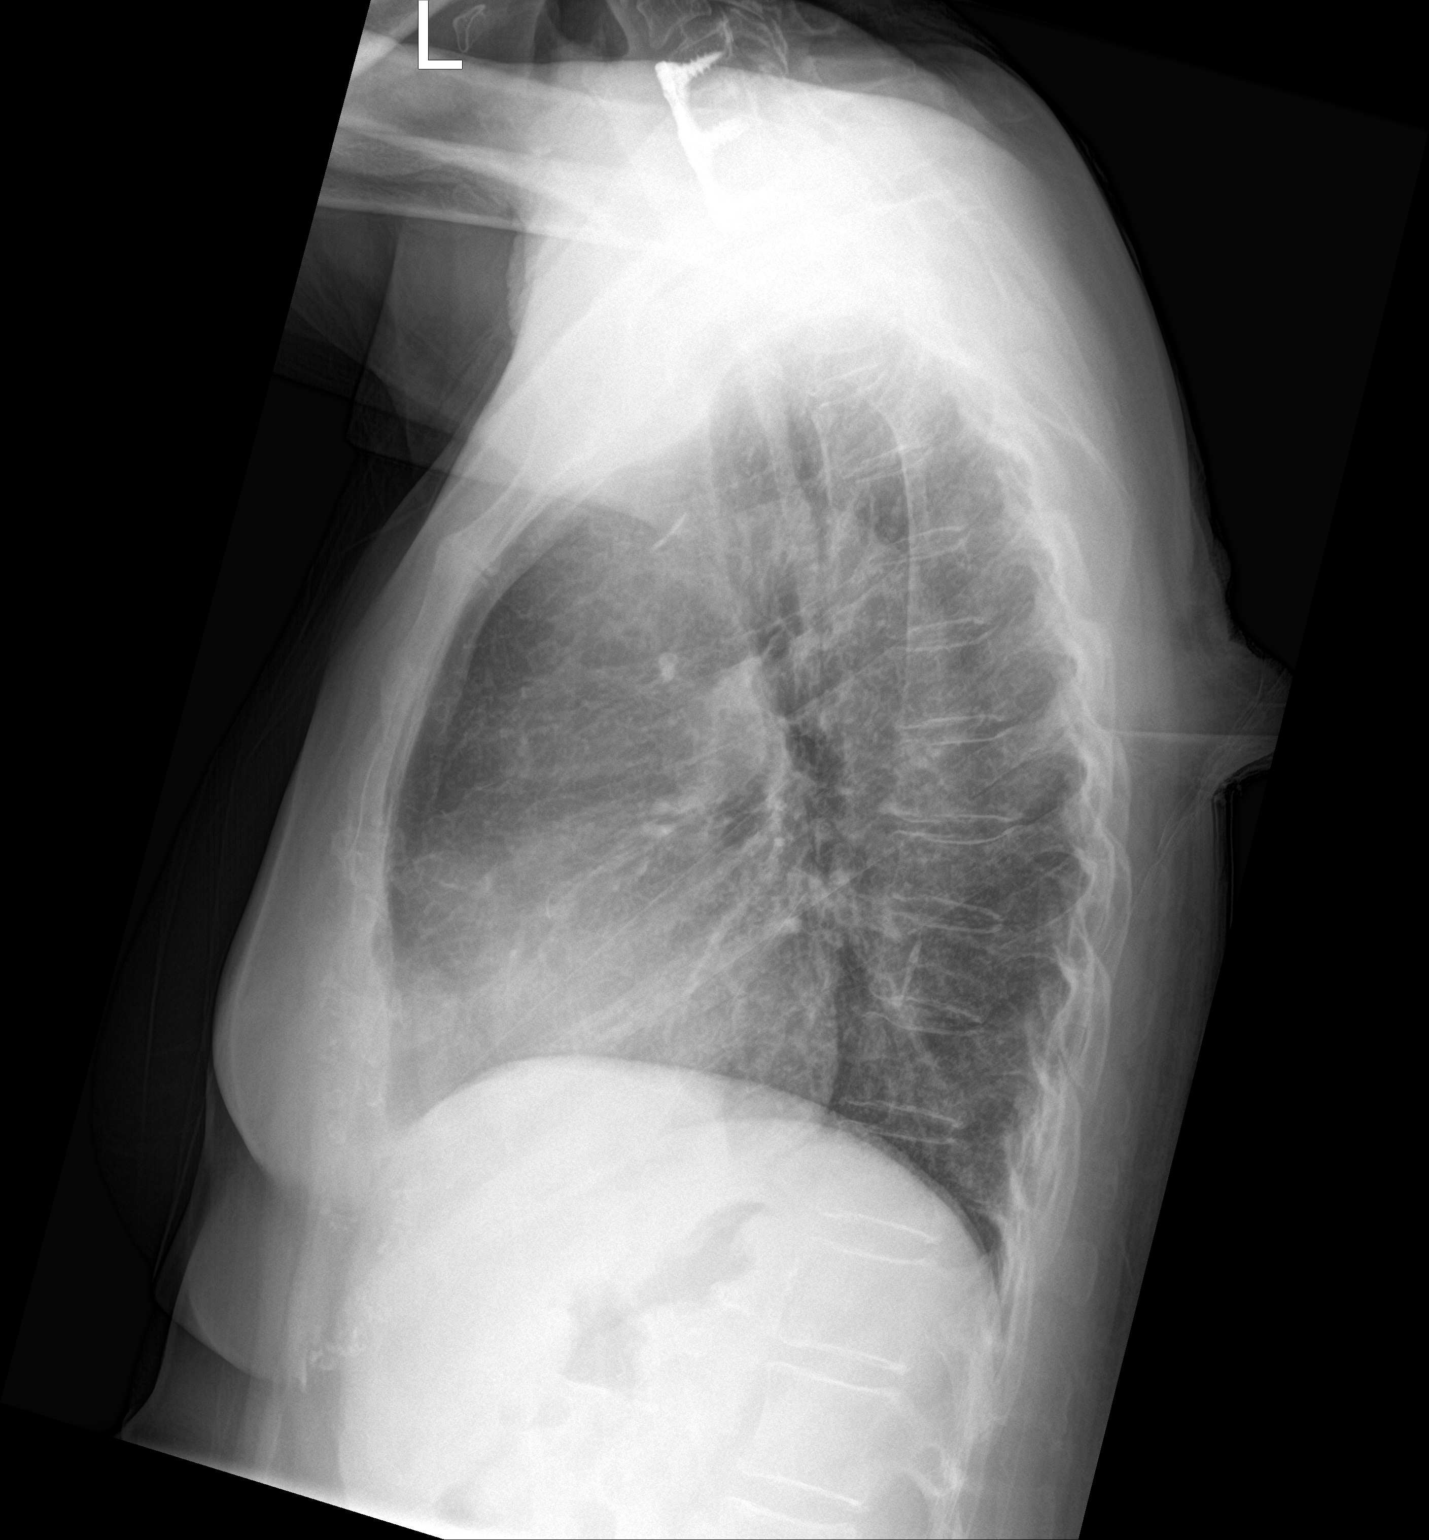

[chest ap strecther]
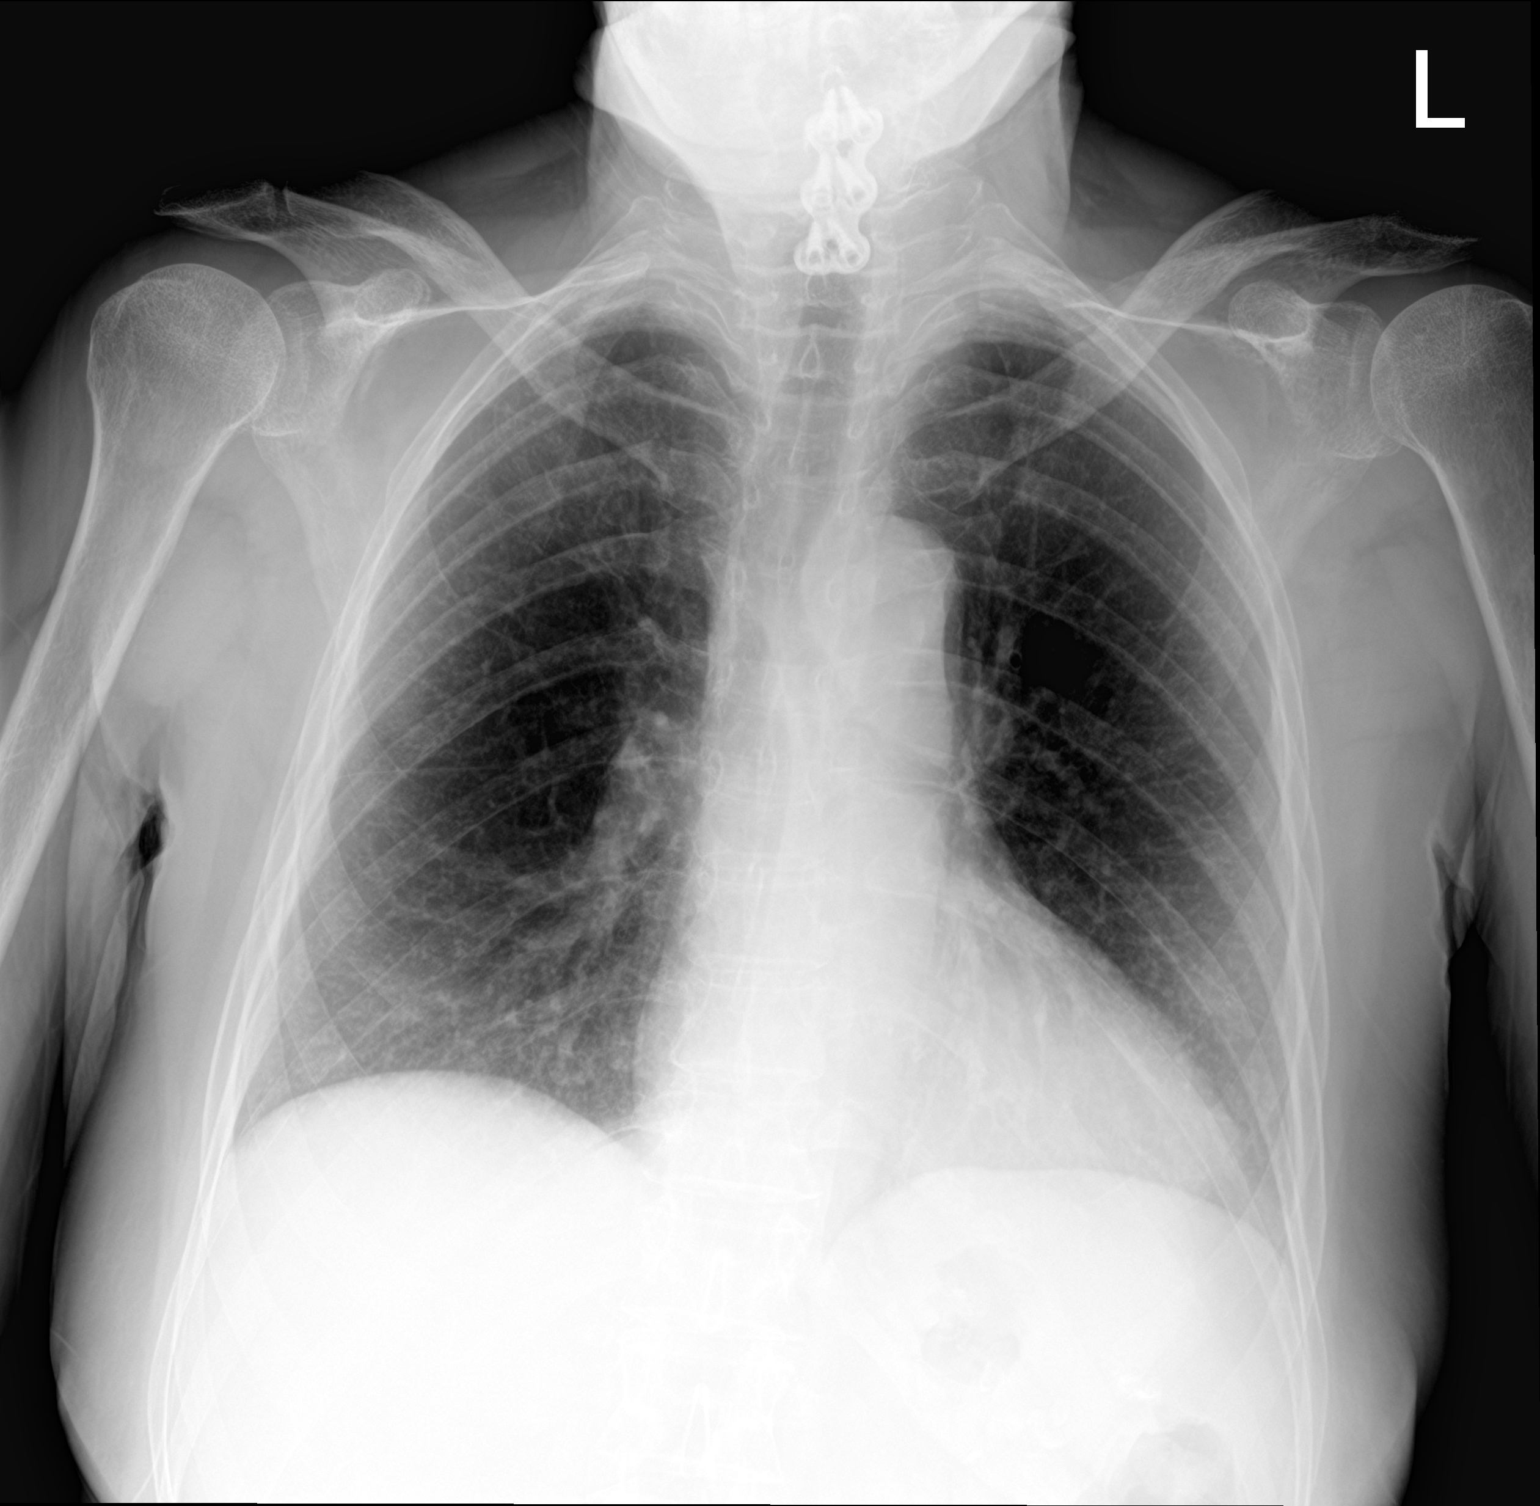

[2 of 2 positions shown; findings below may reference images not displayed]

FINDINGS: The heart size and mediastinal contours are within normal limits.
Both lungs are clear. The visualized skeletal structures are
unremarkable.
IMPRESSION: No active cardiopulmonary disease.

## 2022-07-29 DIAGNOSIS — F0283 Dementia in other diseases classified elsewhere, unspecified severity, with mood disturbance: Secondary | ICD-10-CM | POA: Diagnosis not present

## 2022-07-29 DIAGNOSIS — I10 Essential (primary) hypertension: Secondary | ICD-10-CM | POA: Diagnosis not present

## 2022-07-29 DIAGNOSIS — G309 Alzheimer's disease, unspecified: Secondary | ICD-10-CM | POA: Diagnosis not present

## 2022-07-30 DIAGNOSIS — F0283 Dementia in other diseases classified elsewhere, unspecified severity, with mood disturbance: Secondary | ICD-10-CM | POA: Diagnosis not present

## 2022-07-30 DIAGNOSIS — Z7189 Other specified counseling: Secondary | ICD-10-CM | POA: Diagnosis not present

## 2022-07-30 DIAGNOSIS — F02811 Dementia in other diseases classified elsewhere, unspecified severity, with agitation: Secondary | ICD-10-CM | POA: Diagnosis not present

## 2022-07-30 DIAGNOSIS — G309 Alzheimer's disease, unspecified: Secondary | ICD-10-CM | POA: Diagnosis not present

## 2022-07-30 DIAGNOSIS — F32A Depression, unspecified: Secondary | ICD-10-CM | POA: Diagnosis not present

## 2022-07-30 DIAGNOSIS — I10 Essential (primary) hypertension: Secondary | ICD-10-CM | POA: Diagnosis not present

## 2022-08-07 DIAGNOSIS — R0989 Other specified symptoms and signs involving the circulatory and respiratory systems: Secondary | ICD-10-CM | POA: Diagnosis not present

## 2022-08-08 DIAGNOSIS — G309 Alzheimer's disease, unspecified: Secondary | ICD-10-CM | POA: Diagnosis not present

## 2022-08-08 DIAGNOSIS — I10 Essential (primary) hypertension: Secondary | ICD-10-CM | POA: Diagnosis not present

## 2022-08-08 DIAGNOSIS — M6281 Muscle weakness (generalized): Secondary | ICD-10-CM | POA: Diagnosis not present

## 2022-08-08 DIAGNOSIS — G301 Alzheimer's disease with late onset: Secondary | ICD-10-CM | POA: Diagnosis not present

## 2022-08-08 DIAGNOSIS — E559 Vitamin D deficiency, unspecified: Secondary | ICD-10-CM | POA: Diagnosis not present

## 2022-08-08 DIAGNOSIS — W19XXXA Unspecified fall, initial encounter: Secondary | ICD-10-CM | POA: Diagnosis not present

## 2022-08-08 DIAGNOSIS — F0283 Dementia in other diseases classified elsewhere, unspecified severity, with mood disturbance: Secondary | ICD-10-CM | POA: Diagnosis not present

## 2022-08-08 DIAGNOSIS — I2510 Atherosclerotic heart disease of native coronary artery without angina pectoris: Secondary | ICD-10-CM | POA: Diagnosis not present

## 2022-08-08 DIAGNOSIS — E7800 Pure hypercholesterolemia, unspecified: Secondary | ICD-10-CM | POA: Diagnosis not present

## 2022-08-16 ENCOUNTER — Ambulatory Visit (INDEPENDENT_AMBULATORY_CARE_PROVIDER_SITE_OTHER): Payer: Medicare HMO | Admitting: Podiatry

## 2022-08-16 DIAGNOSIS — Z91199 Patient's noncompliance with other medical treatment and regimen due to unspecified reason: Secondary | ICD-10-CM

## 2022-08-16 NOTE — Progress Notes (Signed)
No show

## 2022-08-20 DIAGNOSIS — G301 Alzheimer's disease with late onset: Secondary | ICD-10-CM | POA: Diagnosis not present

## 2022-08-20 DIAGNOSIS — R278 Other lack of coordination: Secondary | ICD-10-CM | POA: Diagnosis not present

## 2022-08-20 DIAGNOSIS — R2689 Other abnormalities of gait and mobility: Secondary | ICD-10-CM | POA: Diagnosis not present

## 2022-08-20 DIAGNOSIS — F0283 Dementia in other diseases classified elsewhere, unspecified severity, with mood disturbance: Secondary | ICD-10-CM | POA: Diagnosis not present

## 2022-08-23 ENCOUNTER — Ambulatory Visit (INDEPENDENT_AMBULATORY_CARE_PROVIDER_SITE_OTHER): Payer: Medicare HMO | Admitting: Podiatry

## 2022-08-23 ENCOUNTER — Encounter: Payer: Self-pay | Admitting: Podiatry

## 2022-08-23 DIAGNOSIS — L603 Nail dystrophy: Secondary | ICD-10-CM

## 2022-08-23 NOTE — Progress Notes (Signed)
  Subjective:  Patient ID: Kayla Garner, female    DOB: 1945-07-07,   MRN: [id number]  Chief Complaint  Patient presents with   Nail Problem    Bilateral great toe thickness     77 y.o. female presents for concern of bilateral great toe thickness. Here today with daughter who relates she was in a facility and was supposed to have nails trimmed but never did. Relate the great toenails have been painful.  . Denies any other pedal complaints. Denies n/v/f/c.   Past Medical History:  Diagnosis Date   Hypercholesteremia    Hypertension    Pinched nerve in neck    Squamous cell carcinoma of skin 04/23/2011   Right forehead - CX3 + 5FU    Objective:  Physical Exam: Vascular: DP/PT pulses 2/4 bilateral. CFT <3 seconds. Normal hair growth on digits. No edema.  Skin. No lacerations or abrasions bilateral feet. Bilateral hallux nails are thickened and dystrophic Musculoskeletal: MMT 5/5 bilateral lower extremities in DF, PF, Inversion and Eversion. Deceased ROM in DF of ankle joint.  Neurological: Sensation intact to light touch.   Assessment:   1. Onychodystrophy      Plan:  Patient was evaluated and treated and all questions answered. Discussed dystrophic toenails etiology and treatment options including procedure for removal vs conservative care.  Nails debrided back as courtesy today to patient comfort.  Return in future for nail debridement as necessary.     Louann Sjogren, DPM

## 2022-08-29 DIAGNOSIS — R278 Other lack of coordination: Secondary | ICD-10-CM | POA: Diagnosis not present

## 2022-08-29 DIAGNOSIS — F0283 Dementia in other diseases classified elsewhere, unspecified severity, with mood disturbance: Secondary | ICD-10-CM | POA: Diagnosis not present

## 2022-08-29 DIAGNOSIS — G301 Alzheimer's disease with late onset: Secondary | ICD-10-CM | POA: Diagnosis not present

## 2022-08-29 DIAGNOSIS — R2689 Other abnormalities of gait and mobility: Secondary | ICD-10-CM | POA: Diagnosis not present

## 2022-09-03 DIAGNOSIS — G301 Alzheimer's disease with late onset: Secondary | ICD-10-CM | POA: Diagnosis not present

## 2022-09-03 DIAGNOSIS — R278 Other lack of coordination: Secondary | ICD-10-CM | POA: Diagnosis not present

## 2022-09-03 DIAGNOSIS — R2689 Other abnormalities of gait and mobility: Secondary | ICD-10-CM | POA: Diagnosis not present

## 2022-09-03 DIAGNOSIS — F0283 Dementia in other diseases classified elsewhere, unspecified severity, with mood disturbance: Secondary | ICD-10-CM | POA: Diagnosis not present

## 2022-09-04 DIAGNOSIS — G301 Alzheimer's disease with late onset: Secondary | ICD-10-CM | POA: Diagnosis not present

## 2022-09-04 DIAGNOSIS — R278 Other lack of coordination: Secondary | ICD-10-CM | POA: Diagnosis not present

## 2022-09-04 DIAGNOSIS — F0283 Dementia in other diseases classified elsewhere, unspecified severity, with mood disturbance: Secondary | ICD-10-CM | POA: Diagnosis not present

## 2022-09-04 DIAGNOSIS — R2689 Other abnormalities of gait and mobility: Secondary | ICD-10-CM | POA: Diagnosis not present

## 2022-09-06 DIAGNOSIS — F0283 Dementia in other diseases classified elsewhere, unspecified severity, with mood disturbance: Secondary | ICD-10-CM | POA: Diagnosis not present

## 2022-09-06 DIAGNOSIS — R2689 Other abnormalities of gait and mobility: Secondary | ICD-10-CM | POA: Diagnosis not present

## 2022-09-06 DIAGNOSIS — R278 Other lack of coordination: Secondary | ICD-10-CM | POA: Diagnosis not present

## 2022-09-06 DIAGNOSIS — G301 Alzheimer's disease with late onset: Secondary | ICD-10-CM | POA: Diagnosis not present

## 2022-09-09 DIAGNOSIS — G301 Alzheimer's disease with late onset: Secondary | ICD-10-CM | POA: Diagnosis not present

## 2022-09-09 DIAGNOSIS — F0283 Dementia in other diseases classified elsewhere, unspecified severity, with mood disturbance: Secondary | ICD-10-CM | POA: Diagnosis not present

## 2022-09-09 DIAGNOSIS — R2689 Other abnormalities of gait and mobility: Secondary | ICD-10-CM | POA: Diagnosis not present

## 2022-09-09 DIAGNOSIS — R278 Other lack of coordination: Secondary | ICD-10-CM | POA: Diagnosis not present

## 2022-09-11 DIAGNOSIS — R278 Other lack of coordination: Secondary | ICD-10-CM | POA: Diagnosis not present

## 2022-09-11 DIAGNOSIS — F0283 Dementia in other diseases classified elsewhere, unspecified severity, with mood disturbance: Secondary | ICD-10-CM | POA: Diagnosis not present

## 2022-09-11 DIAGNOSIS — R2689 Other abnormalities of gait and mobility: Secondary | ICD-10-CM | POA: Diagnosis not present

## 2022-09-11 DIAGNOSIS — G301 Alzheimer's disease with late onset: Secondary | ICD-10-CM | POA: Diagnosis not present

## 2022-09-13 DIAGNOSIS — R2689 Other abnormalities of gait and mobility: Secondary | ICD-10-CM | POA: Diagnosis not present

## 2022-09-13 DIAGNOSIS — F0283 Dementia in other diseases classified elsewhere, unspecified severity, with mood disturbance: Secondary | ICD-10-CM | POA: Diagnosis not present

## 2022-09-13 DIAGNOSIS — R278 Other lack of coordination: Secondary | ICD-10-CM | POA: Diagnosis not present

## 2022-09-13 DIAGNOSIS — G301 Alzheimer's disease with late onset: Secondary | ICD-10-CM | POA: Diagnosis not present

## 2022-09-16 DIAGNOSIS — R2689 Other abnormalities of gait and mobility: Secondary | ICD-10-CM | POA: Diagnosis not present

## 2022-09-16 DIAGNOSIS — G301 Alzheimer's disease with late onset: Secondary | ICD-10-CM | POA: Diagnosis not present

## 2022-09-16 DIAGNOSIS — R278 Other lack of coordination: Secondary | ICD-10-CM | POA: Diagnosis not present

## 2022-09-16 DIAGNOSIS — F0283 Dementia in other diseases classified elsewhere, unspecified severity, with mood disturbance: Secondary | ICD-10-CM | POA: Diagnosis not present

## 2022-09-18 DIAGNOSIS — F0283 Dementia in other diseases classified elsewhere, unspecified severity, with mood disturbance: Secondary | ICD-10-CM | POA: Diagnosis not present

## 2022-09-18 DIAGNOSIS — R2689 Other abnormalities of gait and mobility: Secondary | ICD-10-CM | POA: Diagnosis not present

## 2022-09-18 DIAGNOSIS — R278 Other lack of coordination: Secondary | ICD-10-CM | POA: Diagnosis not present

## 2022-09-18 DIAGNOSIS — G301 Alzheimer's disease with late onset: Secondary | ICD-10-CM | POA: Diagnosis not present

## 2022-09-20 DIAGNOSIS — G301 Alzheimer's disease with late onset: Secondary | ICD-10-CM | POA: Diagnosis not present

## 2022-09-20 DIAGNOSIS — F0283 Dementia in other diseases classified elsewhere, unspecified severity, with mood disturbance: Secondary | ICD-10-CM | POA: Diagnosis not present

## 2022-09-20 DIAGNOSIS — R278 Other lack of coordination: Secondary | ICD-10-CM | POA: Diagnosis not present

## 2022-09-20 DIAGNOSIS — R2689 Other abnormalities of gait and mobility: Secondary | ICD-10-CM | POA: Diagnosis not present

## 2022-09-23 DIAGNOSIS — R278 Other lack of coordination: Secondary | ICD-10-CM | POA: Diagnosis not present

## 2022-09-23 DIAGNOSIS — F0283 Dementia in other diseases classified elsewhere, unspecified severity, with mood disturbance: Secondary | ICD-10-CM | POA: Diagnosis not present

## 2022-09-23 DIAGNOSIS — R2689 Other abnormalities of gait and mobility: Secondary | ICD-10-CM | POA: Diagnosis not present

## 2022-09-23 DIAGNOSIS — G301 Alzheimer's disease with late onset: Secondary | ICD-10-CM | POA: Diagnosis not present

## 2022-09-25 DIAGNOSIS — R278 Other lack of coordination: Secondary | ICD-10-CM | POA: Diagnosis not present

## 2022-09-25 DIAGNOSIS — R2689 Other abnormalities of gait and mobility: Secondary | ICD-10-CM | POA: Diagnosis not present

## 2022-09-25 DIAGNOSIS — F0283 Dementia in other diseases classified elsewhere, unspecified severity, with mood disturbance: Secondary | ICD-10-CM | POA: Diagnosis not present

## 2022-09-25 DIAGNOSIS — G301 Alzheimer's disease with late onset: Secondary | ICD-10-CM | POA: Diagnosis not present

## 2022-09-27 DIAGNOSIS — R278 Other lack of coordination: Secondary | ICD-10-CM | POA: Diagnosis not present

## 2022-09-27 DIAGNOSIS — G301 Alzheimer's disease with late onset: Secondary | ICD-10-CM | POA: Diagnosis not present

## 2022-09-27 DIAGNOSIS — F0283 Dementia in other diseases classified elsewhere, unspecified severity, with mood disturbance: Secondary | ICD-10-CM | POA: Diagnosis not present

## 2022-09-27 DIAGNOSIS — R2689 Other abnormalities of gait and mobility: Secondary | ICD-10-CM | POA: Diagnosis not present

## 2022-09-30 DIAGNOSIS — R2689 Other abnormalities of gait and mobility: Secondary | ICD-10-CM | POA: Diagnosis not present

## 2022-09-30 DIAGNOSIS — G301 Alzheimer's disease with late onset: Secondary | ICD-10-CM | POA: Diagnosis not present

## 2022-09-30 DIAGNOSIS — F0283 Dementia in other diseases classified elsewhere, unspecified severity, with mood disturbance: Secondary | ICD-10-CM | POA: Diagnosis not present

## 2022-09-30 DIAGNOSIS — R278 Other lack of coordination: Secondary | ICD-10-CM | POA: Diagnosis not present

## 2022-10-02 DIAGNOSIS — G301 Alzheimer's disease with late onset: Secondary | ICD-10-CM | POA: Diagnosis not present

## 2022-10-02 DIAGNOSIS — R2689 Other abnormalities of gait and mobility: Secondary | ICD-10-CM | POA: Diagnosis not present

## 2022-10-02 DIAGNOSIS — F0283 Dementia in other diseases classified elsewhere, unspecified severity, with mood disturbance: Secondary | ICD-10-CM | POA: Diagnosis not present

## 2022-10-02 DIAGNOSIS — R278 Other lack of coordination: Secondary | ICD-10-CM | POA: Diagnosis not present

## 2022-10-03 DIAGNOSIS — F0283 Dementia in other diseases classified elsewhere, unspecified severity, with mood disturbance: Secondary | ICD-10-CM | POA: Diagnosis not present

## 2022-10-03 DIAGNOSIS — G301 Alzheimer's disease with late onset: Secondary | ICD-10-CM | POA: Diagnosis not present

## 2022-10-03 DIAGNOSIS — F03C0 Unspecified dementia, severe, without behavioral disturbance, psychotic disturbance, mood disturbance, and anxiety: Secondary | ICD-10-CM | POA: Diagnosis not present

## 2022-10-03 DIAGNOSIS — R278 Other lack of coordination: Secondary | ICD-10-CM | POA: Diagnosis not present

## 2022-10-03 DIAGNOSIS — R634 Abnormal weight loss: Secondary | ICD-10-CM | POA: Diagnosis not present

## 2022-10-03 DIAGNOSIS — E7800 Pure hypercholesterolemia, unspecified: Secondary | ICD-10-CM | POA: Diagnosis not present

## 2022-10-03 DIAGNOSIS — I10 Essential (primary) hypertension: Secondary | ICD-10-CM | POA: Diagnosis not present

## 2022-10-03 DIAGNOSIS — R2689 Other abnormalities of gait and mobility: Secondary | ICD-10-CM | POA: Diagnosis not present

## 2022-10-07 DIAGNOSIS — R2681 Unsteadiness on feet: Secondary | ICD-10-CM | POA: Diagnosis not present

## 2022-10-07 DIAGNOSIS — G301 Alzheimer's disease with late onset: Secondary | ICD-10-CM | POA: Diagnosis not present

## 2022-10-07 DIAGNOSIS — M6281 Muscle weakness (generalized): Secondary | ICD-10-CM | POA: Diagnosis not present

## 2022-10-07 DIAGNOSIS — R2689 Other abnormalities of gait and mobility: Secondary | ICD-10-CM | POA: Diagnosis not present

## 2022-10-08 DIAGNOSIS — R2681 Unsteadiness on feet: Secondary | ICD-10-CM | POA: Diagnosis not present

## 2022-10-08 DIAGNOSIS — M6281 Muscle weakness (generalized): Secondary | ICD-10-CM | POA: Diagnosis not present

## 2022-10-08 DIAGNOSIS — G301 Alzheimer's disease with late onset: Secondary | ICD-10-CM | POA: Diagnosis not present

## 2022-10-08 DIAGNOSIS — R2689 Other abnormalities of gait and mobility: Secondary | ICD-10-CM | POA: Diagnosis not present

## 2022-10-11 DIAGNOSIS — R2681 Unsteadiness on feet: Secondary | ICD-10-CM | POA: Diagnosis not present

## 2022-10-11 DIAGNOSIS — M6281 Muscle weakness (generalized): Secondary | ICD-10-CM | POA: Diagnosis not present

## 2022-10-11 DIAGNOSIS — R2689 Other abnormalities of gait and mobility: Secondary | ICD-10-CM | POA: Diagnosis not present

## 2022-10-11 DIAGNOSIS — G301 Alzheimer's disease with late onset: Secondary | ICD-10-CM | POA: Diagnosis not present

## 2022-10-14 DIAGNOSIS — R2689 Other abnormalities of gait and mobility: Secondary | ICD-10-CM | POA: Diagnosis not present

## 2022-10-14 DIAGNOSIS — R2681 Unsteadiness on feet: Secondary | ICD-10-CM | POA: Diagnosis not present

## 2022-10-14 DIAGNOSIS — G301 Alzheimer's disease with late onset: Secondary | ICD-10-CM | POA: Diagnosis not present

## 2022-10-14 DIAGNOSIS — M6281 Muscle weakness (generalized): Secondary | ICD-10-CM | POA: Diagnosis not present

## 2022-10-16 DIAGNOSIS — G301 Alzheimer's disease with late onset: Secondary | ICD-10-CM | POA: Diagnosis not present

## 2022-10-16 DIAGNOSIS — R2689 Other abnormalities of gait and mobility: Secondary | ICD-10-CM | POA: Diagnosis not present

## 2022-10-16 DIAGNOSIS — R2681 Unsteadiness on feet: Secondary | ICD-10-CM | POA: Diagnosis not present

## 2022-10-16 DIAGNOSIS — M6281 Muscle weakness (generalized): Secondary | ICD-10-CM | POA: Diagnosis not present

## 2022-10-18 DIAGNOSIS — R2681 Unsteadiness on feet: Secondary | ICD-10-CM | POA: Diagnosis not present

## 2022-10-18 DIAGNOSIS — R2689 Other abnormalities of gait and mobility: Secondary | ICD-10-CM | POA: Diagnosis not present

## 2022-10-18 DIAGNOSIS — G301 Alzheimer's disease with late onset: Secondary | ICD-10-CM | POA: Diagnosis not present

## 2022-10-18 DIAGNOSIS — M6281 Muscle weakness (generalized): Secondary | ICD-10-CM | POA: Diagnosis not present

## 2022-11-04 DIAGNOSIS — Z7189 Other specified counseling: Secondary | ICD-10-CM | POA: Diagnosis not present

## 2022-11-04 DIAGNOSIS — U071 COVID-19: Secondary | ICD-10-CM | POA: Diagnosis not present

## 2022-11-05 DIAGNOSIS — U071 COVID-19: Secondary | ICD-10-CM | POA: Diagnosis not present

## 2022-11-05 DIAGNOSIS — E870 Hyperosmolality and hypernatremia: Secondary | ICD-10-CM | POA: Diagnosis not present

## 2022-11-05 DIAGNOSIS — R2689 Other abnormalities of gait and mobility: Secondary | ICD-10-CM | POA: Diagnosis not present

## 2022-11-05 DIAGNOSIS — E876 Hypokalemia: Secondary | ICD-10-CM | POA: Diagnosis not present

## 2022-11-05 DIAGNOSIS — J1281 Pneumonia due to SARS-associated coronavirus: Secondary | ICD-10-CM | POA: Diagnosis not present

## 2022-11-05 DIAGNOSIS — R0989 Other specified symptoms and signs involving the circulatory and respiratory systems: Secondary | ICD-10-CM | POA: Diagnosis not present

## 2022-11-05 DIAGNOSIS — F0283 Dementia in other diseases classified elsewhere, unspecified severity, with mood disturbance: Secondary | ICD-10-CM | POA: Diagnosis not present

## 2022-11-05 DIAGNOSIS — Z7189 Other specified counseling: Secondary | ICD-10-CM | POA: Diagnosis not present

## 2022-11-06 DIAGNOSIS — R2681 Unsteadiness on feet: Secondary | ICD-10-CM | POA: Diagnosis not present

## 2022-11-06 DIAGNOSIS — M6281 Muscle weakness (generalized): Secondary | ICD-10-CM | POA: Diagnosis not present

## 2022-11-06 DIAGNOSIS — I10 Essential (primary) hypertension: Secondary | ICD-10-CM | POA: Diagnosis not present

## 2022-11-06 DIAGNOSIS — G301 Alzheimer's disease with late onset: Secondary | ICD-10-CM | POA: Diagnosis not present

## 2022-11-06 DIAGNOSIS — R2689 Other abnormalities of gait and mobility: Secondary | ICD-10-CM | POA: Diagnosis not present

## 2022-11-08 DIAGNOSIS — E860 Dehydration: Secondary | ICD-10-CM | POA: Diagnosis not present

## 2022-11-08 DIAGNOSIS — M6281 Muscle weakness (generalized): Secondary | ICD-10-CM | POA: Diagnosis not present

## 2022-11-08 DIAGNOSIS — R2689 Other abnormalities of gait and mobility: Secondary | ICD-10-CM | POA: Diagnosis not present

## 2022-11-08 DIAGNOSIS — G301 Alzheimer's disease with late onset: Secondary | ICD-10-CM | POA: Diagnosis not present

## 2022-11-08 DIAGNOSIS — R2681 Unsteadiness on feet: Secondary | ICD-10-CM | POA: Diagnosis not present

## 2022-11-12 DIAGNOSIS — R2681 Unsteadiness on feet: Secondary | ICD-10-CM | POA: Diagnosis not present

## 2022-11-12 DIAGNOSIS — R2689 Other abnormalities of gait and mobility: Secondary | ICD-10-CM | POA: Diagnosis not present

## 2022-11-12 DIAGNOSIS — M6281 Muscle weakness (generalized): Secondary | ICD-10-CM | POA: Diagnosis not present

## 2022-11-12 DIAGNOSIS — G301 Alzheimer's disease with late onset: Secondary | ICD-10-CM | POA: Diagnosis not present

## 2022-11-13 DIAGNOSIS — R2689 Other abnormalities of gait and mobility: Secondary | ICD-10-CM | POA: Diagnosis not present

## 2022-11-13 DIAGNOSIS — G301 Alzheimer's disease with late onset: Secondary | ICD-10-CM | POA: Diagnosis not present

## 2022-11-13 DIAGNOSIS — M6281 Muscle weakness (generalized): Secondary | ICD-10-CM | POA: Diagnosis not present

## 2022-11-13 DIAGNOSIS — R2681 Unsteadiness on feet: Secondary | ICD-10-CM | POA: Diagnosis not present

## 2022-11-14 DIAGNOSIS — R2681 Unsteadiness on feet: Secondary | ICD-10-CM | POA: Diagnosis not present

## 2022-11-14 DIAGNOSIS — R2689 Other abnormalities of gait and mobility: Secondary | ICD-10-CM | POA: Diagnosis not present

## 2022-11-14 DIAGNOSIS — G301 Alzheimer's disease with late onset: Secondary | ICD-10-CM | POA: Diagnosis not present

## 2022-11-14 DIAGNOSIS — M6281 Muscle weakness (generalized): Secondary | ICD-10-CM | POA: Diagnosis not present

## 2022-11-15 DIAGNOSIS — M6281 Muscle weakness (generalized): Secondary | ICD-10-CM | POA: Diagnosis not present

## 2022-11-15 DIAGNOSIS — R2681 Unsteadiness on feet: Secondary | ICD-10-CM | POA: Diagnosis not present

## 2022-11-15 DIAGNOSIS — G301 Alzheimer's disease with late onset: Secondary | ICD-10-CM | POA: Diagnosis not present

## 2022-11-15 DIAGNOSIS — R2689 Other abnormalities of gait and mobility: Secondary | ICD-10-CM | POA: Diagnosis not present

## 2022-11-18 DIAGNOSIS — M6281 Muscle weakness (generalized): Secondary | ICD-10-CM | POA: Diagnosis not present

## 2022-11-18 DIAGNOSIS — G301 Alzheimer's disease with late onset: Secondary | ICD-10-CM | POA: Diagnosis not present

## 2022-11-18 DIAGNOSIS — R2681 Unsteadiness on feet: Secondary | ICD-10-CM | POA: Diagnosis not present

## 2022-11-18 DIAGNOSIS — R2689 Other abnormalities of gait and mobility: Secondary | ICD-10-CM | POA: Diagnosis not present

## 2022-11-20 DIAGNOSIS — R2689 Other abnormalities of gait and mobility: Secondary | ICD-10-CM | POA: Diagnosis not present

## 2022-11-20 DIAGNOSIS — M6281 Muscle weakness (generalized): Secondary | ICD-10-CM | POA: Diagnosis not present

## 2022-11-20 DIAGNOSIS — G301 Alzheimer's disease with late onset: Secondary | ICD-10-CM | POA: Diagnosis not present

## 2022-11-20 DIAGNOSIS — R2681 Unsteadiness on feet: Secondary | ICD-10-CM | POA: Diagnosis not present

## 2022-11-25 DIAGNOSIS — G301 Alzheimer's disease with late onset: Secondary | ICD-10-CM | POA: Diagnosis not present

## 2022-11-25 DIAGNOSIS — R2681 Unsteadiness on feet: Secondary | ICD-10-CM | POA: Diagnosis not present

## 2022-11-25 DIAGNOSIS — M6281 Muscle weakness (generalized): Secondary | ICD-10-CM | POA: Diagnosis not present

## 2022-11-25 DIAGNOSIS — R2689 Other abnormalities of gait and mobility: Secondary | ICD-10-CM | POA: Diagnosis not present

## 2022-11-27 DIAGNOSIS — G301 Alzheimer's disease with late onset: Secondary | ICD-10-CM | POA: Diagnosis not present

## 2022-11-27 DIAGNOSIS — M6281 Muscle weakness (generalized): Secondary | ICD-10-CM | POA: Diagnosis not present

## 2022-11-27 DIAGNOSIS — R2681 Unsteadiness on feet: Secondary | ICD-10-CM | POA: Diagnosis not present

## 2022-11-27 DIAGNOSIS — R2689 Other abnormalities of gait and mobility: Secondary | ICD-10-CM | POA: Diagnosis not present

## 2022-11-29 DIAGNOSIS — M6281 Muscle weakness (generalized): Secondary | ICD-10-CM | POA: Diagnosis not present

## 2022-11-29 DIAGNOSIS — R2681 Unsteadiness on feet: Secondary | ICD-10-CM | POA: Diagnosis not present

## 2022-11-29 DIAGNOSIS — R2689 Other abnormalities of gait and mobility: Secondary | ICD-10-CM | POA: Diagnosis not present

## 2022-11-29 DIAGNOSIS — G301 Alzheimer's disease with late onset: Secondary | ICD-10-CM | POA: Diagnosis not present

## 2022-12-02 DIAGNOSIS — M6281 Muscle weakness (generalized): Secondary | ICD-10-CM | POA: Diagnosis not present

## 2022-12-02 DIAGNOSIS — G301 Alzheimer's disease with late onset: Secondary | ICD-10-CM | POA: Diagnosis not present

## 2022-12-02 DIAGNOSIS — R2681 Unsteadiness on feet: Secondary | ICD-10-CM | POA: Diagnosis not present

## 2022-12-02 DIAGNOSIS — R2689 Other abnormalities of gait and mobility: Secondary | ICD-10-CM | POA: Diagnosis not present

## 2022-12-10 DIAGNOSIS — R2689 Other abnormalities of gait and mobility: Secondary | ICD-10-CM | POA: Diagnosis not present

## 2022-12-10 DIAGNOSIS — M6281 Muscle weakness (generalized): Secondary | ICD-10-CM | POA: Diagnosis not present

## 2022-12-10 DIAGNOSIS — R2681 Unsteadiness on feet: Secondary | ICD-10-CM | POA: Diagnosis not present

## 2022-12-11 DIAGNOSIS — R2689 Other abnormalities of gait and mobility: Secondary | ICD-10-CM | POA: Diagnosis not present

## 2022-12-11 DIAGNOSIS — R2681 Unsteadiness on feet: Secondary | ICD-10-CM | POA: Diagnosis not present

## 2022-12-11 DIAGNOSIS — M6281 Muscle weakness (generalized): Secondary | ICD-10-CM | POA: Diagnosis not present

## 2022-12-12 DIAGNOSIS — R2689 Other abnormalities of gait and mobility: Secondary | ICD-10-CM | POA: Diagnosis not present

## 2022-12-12 DIAGNOSIS — R2681 Unsteadiness on feet: Secondary | ICD-10-CM | POA: Diagnosis not present

## 2022-12-12 DIAGNOSIS — M6281 Muscle weakness (generalized): Secondary | ICD-10-CM | POA: Diagnosis not present

## 2022-12-13 DIAGNOSIS — M6281 Muscle weakness (generalized): Secondary | ICD-10-CM | POA: Diagnosis not present

## 2022-12-13 DIAGNOSIS — R2689 Other abnormalities of gait and mobility: Secondary | ICD-10-CM | POA: Diagnosis not present

## 2022-12-13 DIAGNOSIS — R2681 Unsteadiness on feet: Secondary | ICD-10-CM | POA: Diagnosis not present

## 2022-12-16 DIAGNOSIS — M6281 Muscle weakness (generalized): Secondary | ICD-10-CM | POA: Diagnosis not present

## 2022-12-16 DIAGNOSIS — R2689 Other abnormalities of gait and mobility: Secondary | ICD-10-CM | POA: Diagnosis not present

## 2022-12-16 DIAGNOSIS — R2681 Unsteadiness on feet: Secondary | ICD-10-CM | POA: Diagnosis not present

## 2023-01-03 DIAGNOSIS — I10 Essential (primary) hypertension: Secondary | ICD-10-CM | POA: Diagnosis not present

## 2023-01-03 DIAGNOSIS — Z7689 Persons encountering health services in other specified circumstances: Secondary | ICD-10-CM | POA: Diagnosis not present

## 2023-01-03 DIAGNOSIS — R634 Abnormal weight loss: Secondary | ICD-10-CM | POA: Diagnosis not present

## 2023-01-03 DIAGNOSIS — G309 Alzheimer's disease, unspecified: Secondary | ICD-10-CM | POA: Diagnosis not present

## 2023-01-03 DIAGNOSIS — F0283 Dementia in other diseases classified elsewhere, unspecified severity, with mood disturbance: Secondary | ICD-10-CM | POA: Diagnosis not present

## 2023-01-17 DIAGNOSIS — E559 Vitamin D deficiency, unspecified: Secondary | ICD-10-CM | POA: Diagnosis not present

## 2023-01-17 DIAGNOSIS — I10 Essential (primary) hypertension: Secondary | ICD-10-CM | POA: Diagnosis not present

## 2023-01-17 DIAGNOSIS — F0283 Dementia in other diseases classified elsewhere, unspecified severity, with mood disturbance: Secondary | ICD-10-CM | POA: Diagnosis not present

## 2023-01-17 DIAGNOSIS — G309 Alzheimer's disease, unspecified: Secondary | ICD-10-CM | POA: Diagnosis not present

## 2023-01-17 DIAGNOSIS — E7800 Pure hypercholesterolemia, unspecified: Secondary | ICD-10-CM | POA: Diagnosis not present

## 2023-05-10 DEATH — deceased
